# Patient Record
Sex: Female | Born: 1965 | Race: White | Hispanic: No | Marital: Married | Smoking: Never smoker
Health system: Southern US, Community
[De-identification: ages and names within clinical notes are randomized; demographics above are authoritative.]

## PROBLEM LIST (undated history)

## (undated) DIAGNOSIS — F3281 Premenstrual dysphoric disorder: Secondary | ICD-10-CM

## (undated) HISTORY — PX: KIDNEY SURGERY: SHX687

---

## 1998-05-15 ENCOUNTER — Other Ambulatory Visit: Admission: RE | Admit: 1998-05-15 | Discharge: 1998-05-15 | Payer: Self-pay | Admitting: Obstetrics and Gynecology

## 1999-09-26 ENCOUNTER — Ambulatory Visit (HOSPITAL_COMMUNITY): Admission: RE | Admit: 1999-09-26 | Discharge: 1999-09-26 | Payer: Self-pay | Admitting: Urology

## 1999-09-26 ENCOUNTER — Encounter: Payer: Self-pay | Admitting: Urology

## 1999-10-07 ENCOUNTER — Ambulatory Visit (HOSPITAL_COMMUNITY): Admission: RE | Admit: 1999-10-07 | Discharge: 1999-10-07 | Payer: Self-pay | Admitting: Urology

## 1999-10-07 ENCOUNTER — Encounter: Payer: Self-pay | Admitting: Urology

## 1999-11-13 ENCOUNTER — Ambulatory Visit (HOSPITAL_COMMUNITY): Admission: RE | Admit: 1999-11-13 | Discharge: 1999-11-13 | Payer: Self-pay | Admitting: Urology

## 1999-11-13 ENCOUNTER — Encounter: Payer: Self-pay | Admitting: Urology

## 1999-12-04 ENCOUNTER — Ambulatory Visit (HOSPITAL_COMMUNITY): Admission: RE | Admit: 1999-12-04 | Discharge: 1999-12-04 | Payer: Self-pay | Admitting: Urology

## 2000-01-09 ENCOUNTER — Inpatient Hospital Stay (HOSPITAL_COMMUNITY): Admission: RE | Admit: 2000-01-09 | Discharge: 2000-01-12 | Payer: Self-pay | Admitting: Urology

## 2000-03-12 ENCOUNTER — Other Ambulatory Visit: Admission: RE | Admit: 2000-03-12 | Discharge: 2000-03-12 | Payer: Self-pay | Admitting: Obstetrics and Gynecology

## 2000-04-30 ENCOUNTER — Encounter: Payer: Self-pay | Admitting: Urology

## 2000-04-30 ENCOUNTER — Ambulatory Visit (HOSPITAL_COMMUNITY): Admission: RE | Admit: 2000-04-30 | Discharge: 2000-04-30 | Payer: Self-pay | Admitting: Urology

## 2000-10-14 ENCOUNTER — Ambulatory Visit (HOSPITAL_COMMUNITY): Admission: RE | Admit: 2000-10-14 | Discharge: 2000-10-14 | Payer: Self-pay | Admitting: Urology

## 2000-10-14 ENCOUNTER — Encounter: Payer: Self-pay | Admitting: Urology

## 2002-03-28 ENCOUNTER — Other Ambulatory Visit: Admission: RE | Admit: 2002-03-28 | Discharge: 2002-03-28 | Payer: Self-pay | Admitting: Obstetrics and Gynecology

## 2003-03-30 ENCOUNTER — Other Ambulatory Visit: Admission: RE | Admit: 2003-03-30 | Discharge: 2003-03-30 | Payer: Self-pay | Admitting: Obstetrics and Gynecology

## 2004-04-04 ENCOUNTER — Other Ambulatory Visit: Admission: RE | Admit: 2004-04-04 | Discharge: 2004-04-04 | Payer: Self-pay | Admitting: Obstetrics and Gynecology

## 2004-12-18 ENCOUNTER — Ambulatory Visit: Payer: Self-pay | Admitting: Internal Medicine

## 2005-01-15 ENCOUNTER — Ambulatory Visit: Payer: Self-pay | Admitting: Internal Medicine

## 2005-05-01 ENCOUNTER — Other Ambulatory Visit: Admission: RE | Admit: 2005-05-01 | Discharge: 2005-05-01 | Payer: Self-pay | Admitting: Obstetrics and Gynecology

## 2005-11-18 ENCOUNTER — Ambulatory Visit: Payer: Self-pay | Admitting: Internal Medicine

## 2006-03-06 ENCOUNTER — Ambulatory Visit: Payer: Self-pay | Admitting: Internal Medicine

## 2006-05-15 ENCOUNTER — Other Ambulatory Visit: Admission: RE | Admit: 2006-05-15 | Discharge: 2006-05-15 | Payer: Self-pay | Admitting: Obstetrics and Gynecology

## 2006-10-22 ENCOUNTER — Ambulatory Visit: Payer: Self-pay | Admitting: Family Medicine

## 2006-11-26 ENCOUNTER — Ambulatory Visit: Payer: Self-pay | Admitting: Internal Medicine

## 2006-11-26 LAB — CONVERTED CEMR LAB
Basophils Relative: 1.3 % — ABNORMAL HIGH (ref 0.0–1.0)
Eosinophil percent: 2.9 % (ref 0.0–5.0)
Lymphocytes Relative: 27.3 % (ref 12.0–46.0)
Monocytes Relative: 9.4 % (ref 3.0–11.0)
Platelets: 260 10*3/uL (ref 150–400)
RBC: 4.21 M/uL (ref 3.87–5.11)
RDW: 11.1 % — ABNORMAL LOW (ref 11.5–14.6)
Sed Rate: 35 mm/hr — ABNORMAL HIGH (ref 0–25)
WBC: 7.9 10*3/uL (ref 4.5–10.5)

## 2007-01-25 ENCOUNTER — Ambulatory Visit: Payer: Self-pay | Admitting: Internal Medicine

## 2007-01-26 ENCOUNTER — Encounter: Payer: Self-pay | Admitting: Internal Medicine

## 2007-06-18 ENCOUNTER — Ambulatory Visit: Payer: Self-pay | Admitting: Internal Medicine

## 2008-08-07 ENCOUNTER — Ambulatory Visit: Payer: Self-pay | Admitting: Internal Medicine

## 2008-08-07 LAB — CONVERTED CEMR LAB
Beta hcg, urine, semiquantitative: NEGATIVE
Bilirubin Urine: NEGATIVE
Glucose, Urine, Semiquant: NEGATIVE
Ketones, urine, test strip: NEGATIVE
Specific Gravity, Urine: <1.005
pH: 7.5

## 2008-08-09 ENCOUNTER — Ambulatory Visit: Payer: Self-pay | Admitting: Internal Medicine

## 2008-08-09 ENCOUNTER — Encounter: Payer: Self-pay | Admitting: Internal Medicine

## 2008-08-09 ENCOUNTER — Encounter (INDEPENDENT_AMBULATORY_CARE_PROVIDER_SITE_OTHER): Payer: Self-pay | Admitting: *Deleted

## 2008-08-10 ENCOUNTER — Encounter (INDEPENDENT_AMBULATORY_CARE_PROVIDER_SITE_OTHER): Payer: Self-pay | Admitting: *Deleted

## 2008-08-16 ENCOUNTER — Encounter: Payer: Self-pay | Admitting: Internal Medicine

## 2008-08-24 ENCOUNTER — Ambulatory Visit (HOSPITAL_COMMUNITY): Admission: RE | Admit: 2008-08-24 | Discharge: 2008-08-24 | Payer: Self-pay | Admitting: Urology

## 2009-02-12 ENCOUNTER — Encounter: Payer: Self-pay | Admitting: Internal Medicine

## 2009-02-12 ENCOUNTER — Ambulatory Visit: Payer: Self-pay | Admitting: Internal Medicine

## 2009-02-13 ENCOUNTER — Encounter (INDEPENDENT_AMBULATORY_CARE_PROVIDER_SITE_OTHER): Payer: Self-pay | Admitting: *Deleted

## 2009-02-13 ENCOUNTER — Telehealth (INDEPENDENT_AMBULATORY_CARE_PROVIDER_SITE_OTHER): Payer: Self-pay | Admitting: *Deleted

## 2009-10-15 ENCOUNTER — Telehealth: Payer: Self-pay | Admitting: Internal Medicine

## 2009-10-16 ENCOUNTER — Ambulatory Visit: Payer: Self-pay | Admitting: Internal Medicine

## 2009-10-16 DIAGNOSIS — R079 Chest pain, unspecified: Secondary | ICD-10-CM

## 2009-10-16 DIAGNOSIS — R002 Palpitations: Secondary | ICD-10-CM

## 2009-10-16 HISTORY — DX: Palpitations: R00.2

## 2009-10-16 HISTORY — DX: Chest pain, unspecified: R07.9

## 2009-10-18 ENCOUNTER — Encounter (INDEPENDENT_AMBULATORY_CARE_PROVIDER_SITE_OTHER): Payer: Self-pay | Admitting: *Deleted

## 2010-01-05 ENCOUNTER — Ambulatory Visit: Payer: Self-pay | Admitting: Family Medicine

## 2011-01-05 LAB — CONVERTED CEMR LAB
BUN: 11 mg/dL (ref 6–23)
Basophils Relative: 3.6 % — ABNORMAL HIGH (ref 0.0–3.0)
CK-MB: 1.3 ng/mL (ref 0.3–4.0)
Calcium: 9 mg/dL (ref 8.4–10.5)
Eosinophils Absolute: 0.2 10*3/uL (ref 0.0–0.7)
Eosinophils Relative: 3.8 % (ref 0.0–5.0)
Free T4: 0.8 ng/dL (ref 0.6–1.6)
GFR calc non Af Amer: 73.29 mL/min (ref >60)
Glucose, Bld: 66 mg/dL — ABNORMAL LOW (ref 70–99)
Lymphocytes Relative: 41.2 % (ref 12.0–46.0)
MCV: 97.5 fL (ref 78.0–100.0)
Monocytes Absolute: 2 10*3/uL — ABNORMAL HIGH (ref 0.1–1.0)
Neutrophils Relative %: 18.2 % — ABNORMAL LOW (ref 43.0–77.0)
Platelets: 169 10*3/uL (ref 150.0–400.0)
Potassium: 4.1 meq/L (ref 3.5–5.1)
RBC: 4.27 M/uL (ref 3.87–5.11)
Sodium: 140 meq/L (ref 135–145)
TSH: 1.8 microintl units/mL (ref 0.35–5.50)
Total CK: 98 units/L (ref 7–177)
WBC: 5.9 10*3/uL (ref 4.5–10.5)

## 2011-01-09 NOTE — Assessment & Plan Note (Signed)
Summary: HEADACHE/FEVER/KDC   Vital Signs:  Patient profile:   45 year old female Weight:      129 pounds Temp:     98.6 degrees F oral Pulse rate:   68 / minute Pulse rhythm:   regular BP sitting:   110 / 82  (left arm) Cuff size:   regular  Vitals Entered By: Lowella Petties CMA (January 05, 2010 10:30 AM) CC: Cough, achy, feels weak.  Temp 99.7 last night.   CC:  Cough, achy, and feels weak.  Temp 99.7 last night..  History of Present Illness: 45 y/o fem w 2 days h/o nonprod. cough.  No fever etc.   Ros neg exp. EIA in college.  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  Husb has infl.  Allergies: 1)  ! * Pce 2)  ! * Tetanus 3)  ! Pyridium 4)  ! Levsin  Past History:  Past medical, surgical, family and social histories (including risk factors) reviewed for relevance to current acute and chronic problems.  Past Surgical History: Reviewed history from 08/07/2008 and no changes required. 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  Social History: Reviewed history and no changes required.  Review of Systems      See HPI  Physical Exam  General:  Well-developed,well-nourished,in no acute distress; alert,appropriate and cooperative throughout examination Head:  Normocephalic and atraumatic without obvious abnormalities. No apparent alopecia or balding. Eyes:  No corneal or conjunctival inflammation noted. EOMI. Perrla. Funduscopic exam benign, without hemorrhages, exudates or papilledema. Vision grossly normal. Ears:  External ear exam shows no significant lesions or deformities.  Otoscopic examination reveals clear canals, tympanic membranes are intact bilaterally without bulging, retraction, inflammation or discharge. Hearing is grossly normal bilaterally. Nose:  External nasal examination shows no deformity or inflammation. Nasal mucosa are pink and moist without lesions or  exudates. Mouth:  Oral mucosa and oropharynx without lesions or exudates.  Teeth in good repair. Neck:  No deformities, masses, or tenderness noted. Chest Wall:  No deformities, masses, or tenderness noted. Lungs:  Normal respiratory effort, chest expands symmetrically. Lungs are clear to auscultation, no crackles or wheezes.   Problems:  Medical Problems Added: 1)  Dx of Viral Infection-unspec  (ICD-079.99)  Impression & Recommendations:  Problem # 1:  VIRAL INFECTION-UNSPEC (ICD-079.99) Assessment New  Her updated medication list for this problem includes:    Hydrocodone-homatropine 5-1.5 Mg/21ml Syrp (Hydrocodone-homatropine) .Marland Kitchen... 1 or 2 tsps three times a day as needed cough  Complete Medication List: 1)  Multivitamins Tabs (Multiple vitamin) .Marland Kitchen.. 1 by mouth once daily 2)  B Comples  3)  Omega-3 350 Mg Caps (Omega-3 fatty acids) .Marland Kitchen.. 1 by mouth once daily 4)  Antioxidant Tabs (Multiple vitamins-minerals) .Marland Kitchen.. 1 by mouth once daily 5)  Milk Thistle 200 Mg Caps (Milk thistle) .Marland Kitchen.. 1 by mouth once daily 6)  Hydrocodone-homatropine 5-1.5 Mg/21ml Syrp (Hydrocodone-homatropine) .Marland Kitchen.. 1 or 2 tsps three times a day as needed cough  Patient Instructions: 1)  hydromet 1or 2 tsps three times a day prn 2)  Please schedule a follow-up appointment as needed. Prescriptions: HYDROCODONE-HOMATROPINE 5-1.5 MG/5ML SYRP (HYDROCODONE-HOMATROPINE) 1 or 2 tsps three times a day as needed cough  #8oz x 1   Entered and Authorized by:   Roderick Pee MD   Signed by:   Roderick Pee MD on 01/05/2010   Method used:   Print then Give to Patient  RxID:   [id number]

## 2014-01-20 DIAGNOSIS — G4700 Insomnia, unspecified: Secondary | ICD-10-CM | POA: Insufficient documentation

## 2014-01-20 HISTORY — DX: Insomnia, unspecified: G47.00

## 2014-07-08 DIAGNOSIS — R3129 Other microscopic hematuria: Secondary | ICD-10-CM | POA: Insufficient documentation

## 2014-07-08 HISTORY — DX: Other microscopic hematuria: R31.29

## 2015-05-22 DIAGNOSIS — K219 Gastro-esophageal reflux disease without esophagitis: Secondary | ICD-10-CM | POA: Insufficient documentation

## 2015-05-22 HISTORY — DX: Gastro-esophageal reflux disease without esophagitis: K21.9

## 2016-10-09 DIAGNOSIS — F3281 Premenstrual dysphoric disorder: Secondary | ICD-10-CM | POA: Insufficient documentation

## 2017-12-01 DIAGNOSIS — M659 Synovitis and tenosynovitis, unspecified: Secondary | ICD-10-CM

## 2017-12-01 DIAGNOSIS — M65979 Unspecified synovitis and tenosynovitis, unspecified ankle and foot: Secondary | ICD-10-CM | POA: Insufficient documentation

## 2017-12-01 HISTORY — DX: Unspecified synovitis and tenosynovitis, unspecified ankle and foot: M65.979

## 2018-09-08 LAB — HM PAP SMEAR: HM Pap smear: NORMAL

## 2018-11-08 LAB — HM COLONOSCOPY

## 2020-02-23 DIAGNOSIS — M659 Synovitis and tenosynovitis, unspecified: Secondary | ICD-10-CM | POA: Diagnosis not present

## 2020-02-23 DIAGNOSIS — M79671 Pain in right foot: Secondary | ICD-10-CM

## 2020-02-23 HISTORY — DX: Pain in right foot: M79.671

## 2020-04-25 DIAGNOSIS — H0102B Squamous blepharitis left eye, upper and lower eyelids: Secondary | ICD-10-CM | POA: Diagnosis not present

## 2020-04-25 DIAGNOSIS — H524 Presbyopia: Secondary | ICD-10-CM | POA: Diagnosis not present

## 2020-04-25 DIAGNOSIS — H0102A Squamous blepharitis right eye, upper and lower eyelids: Secondary | ICD-10-CM | POA: Diagnosis not present

## 2020-07-09 DIAGNOSIS — M79671 Pain in right foot: Secondary | ICD-10-CM | POA: Diagnosis not present

## 2020-07-09 DIAGNOSIS — M659 Synovitis and tenosynovitis, unspecified: Secondary | ICD-10-CM | POA: Diagnosis not present

## 2020-08-07 ENCOUNTER — Telehealth: Payer: Self-pay | Admitting: Adult Health

## 2020-08-07 NOTE — Telephone Encounter (Signed)
Called to Discuss with patient about Covid symptoms and the use of the monoclonal antibody infusion for those with mild to moderate Covid symptoms and at a high risk of hospitalization.    Unable to reach, unable to leave voicemail. Will try to reach out to again later.   Sterlin Knightly NP-C

## 2020-09-05 DIAGNOSIS — R05 Cough: Secondary | ICD-10-CM | POA: Diagnosis not present

## 2020-09-05 DIAGNOSIS — B9729 Other coronavirus as the cause of diseases classified elsewhere: Secondary | ICD-10-CM | POA: Diagnosis not present

## 2020-09-05 DIAGNOSIS — F3281 Premenstrual dysphoric disorder: Secondary | ICD-10-CM | POA: Diagnosis not present

## 2020-11-27 DIAGNOSIS — M2021 Hallux rigidus, right foot: Secondary | ICD-10-CM | POA: Insufficient documentation

## 2020-11-27 HISTORY — DX: Hallux rigidus, right foot: M20.21

## 2021-02-01 DIAGNOSIS — M19071 Primary osteoarthritis, right ankle and foot: Secondary | ICD-10-CM | POA: Diagnosis not present

## 2021-02-01 DIAGNOSIS — M2021 Hallux rigidus, right foot: Secondary | ICD-10-CM | POA: Diagnosis not present

## 2021-09-03 ENCOUNTER — Other Ambulatory Visit: Payer: Self-pay

## 2021-09-03 ENCOUNTER — Encounter (HOSPITAL_BASED_OUTPATIENT_CLINIC_OR_DEPARTMENT_OTHER): Payer: Self-pay

## 2021-09-03 ENCOUNTER — Emergency Department (HOSPITAL_BASED_OUTPATIENT_CLINIC_OR_DEPARTMENT_OTHER)
Admission: EM | Admit: 2021-09-03 | Discharge: 2021-09-04 | Disposition: A | Discharge status: Home / Self Care | Payer: Self-pay | Attending: Emergency Medicine | Admitting: Emergency Medicine

## 2021-09-03 ENCOUNTER — Emergency Department (HOSPITAL_BASED_OUTPATIENT_CLINIC_OR_DEPARTMENT_OTHER): Payer: Self-pay

## 2021-09-03 DIAGNOSIS — R519 Headache, unspecified: Secondary | ICD-10-CM | POA: Insufficient documentation

## 2021-09-03 DIAGNOSIS — R030 Elevated blood-pressure reading, without diagnosis of hypertension: Secondary | ICD-10-CM | POA: Insufficient documentation

## 2021-09-03 DIAGNOSIS — R0789 Other chest pain: Secondary | ICD-10-CM | POA: Insufficient documentation

## 2021-09-03 DIAGNOSIS — I160 Hypertensive urgency: Secondary | ICD-10-CM

## 2021-09-03 HISTORY — DX: Premenstrual dysphoric disorder: F32.81

## 2021-09-03 LAB — CBC
HCT: 37.8 % (ref 36.0–46.0)
Hemoglobin: 13.3 g/dL (ref 12.0–15.0)
MCH: 32.4 pg (ref 26.0–34.0)
MCHC: 35.2 g/dL (ref 30.0–36.0)
MCV: 92.2 fL (ref 80.0–100.0)
Platelets: 209 10*3/uL (ref 150–400)
RBC: 4.1 MIL/uL (ref 3.87–5.11)
RDW: 11.9 % (ref 11.5–15.5)
WBC: 6.7 10*3/uL (ref 4.0–10.5)
nRBC: 0 % (ref 0.0–0.2)

## 2021-09-03 LAB — BASIC METABOLIC PANEL
Anion gap: 7 (ref 5–15)
BUN: 13 mg/dL (ref 6–20)
CO2: 27 mmol/L (ref 22–32)
Calcium: 9 mg/dL (ref 8.9–10.3)
Chloride: 102 mmol/L (ref 98–111)
Creatinine, Ser: 0.64 mg/dL (ref 0.44–1.00)
GFR, Estimated: >60 mL/min (ref >60)
Glucose, Bld: 191 mg/dL — ABNORMAL HIGH (ref 70–99)
Potassium: 3.6 mmol/L (ref 3.5–5.1)
Sodium: 136 mmol/L (ref 135–145)

## 2021-09-03 LAB — TROPONIN I (HIGH SENSITIVITY): Troponin I (High Sensitivity): 4 ng/L (ref <18)

## 2021-09-03 NOTE — ED Triage Notes (Addendum)
Pt c/o CP, HA started ~615pm-states she took someone else's clonidine 0.1mg  at 645pm -NAD-steady gait-pt later added that HA "is pounding" and started yesterday with elevated BP-denies head injury

## 2021-09-04 ENCOUNTER — Emergency Department (HOSPITAL_BASED_OUTPATIENT_CLINIC_OR_DEPARTMENT_OTHER): Payer: Self-pay

## 2021-09-04 LAB — TROPONIN I (HIGH SENSITIVITY): Troponin I (High Sensitivity): 5 ng/L (ref <18)

## 2021-09-04 NOTE — ED Provider Notes (Signed)
[facility] EMERGENCY DEPARTMENT Provider Note   CSN: [id number] Arrival date & time: 09/03/21  2033     History Chief Complaint  Patient presents with   Chest Pain   Headache    COURTENAY HIRTH is a 55 y.o. female.  Patient is a 55 year old female with no significant past medical history.  She presents today for elevation of blood pressure and headache.  She had an episode yesterday where her blood pressure went to the 180s and she describes pressure in her head.  This seemed to resolve after several hours, then recurred this afternoon.  She describes pressure throughout her head and blood pressure again into the 170s.  She took one of her husbands clonidine tablets and this seemed to improve her blood pressure and headache.  She describes some tightness in her chest during these episodes.  She denies fevers or chills.  She denies any numbness, weakness, or tingling.  She denies any shortness of breath, nausea, or diaphoresis.  Patient has no cardiac history and no cardiac risk factors.  The history is provided by the patient.      Past Medical History:  Diagnosis Date   PMDD (premenstrual dysphoric disorder)     Patient Active Problem List   Diagnosis Date Noted   PALPITATIONS 10/16/2009   CHEST PAIN 10/16/2009    Past Surgical History:  Procedure Laterality Date   KIDNEY SURGERY       OB History   No obstetric history on file.     No family history on file.  Social History   Tobacco Use   Smoking status: Never   Smokeless tobacco: Never  Vaping Use   Vaping Use: Never used  Substance Use Topics   Alcohol use: Yes    Comment: daily   Drug use: Never    Home Medications Prior to Admission medications   Not on File    Allergies    Hyoscyamine sulfate  Review of Systems   Review of Systems  All other systems reviewed and are negative.  Physical Exam Updated Vital Signs BP 130/87 (BP Location: Left Arm)   Pulse (!) 57   Temp 97.8 F  (36.6 C) (Oral)   Resp 16   Ht 5\' 7"  (1.702 m)   Wt 58.5 kg   SpO2 100%   BMI 20.20 kg/m   Physical Exam Vitals and nursing note reviewed.  Constitutional:      General: She is not in acute distress.    Appearance: She is well-developed. She is not diaphoretic.  HENT:     Head: Normocephalic and atraumatic.  Cardiovascular:     Rate and Rhythm: Normal rate and regular rhythm.     Heart sounds: No murmur heard.   No friction rub. No gallop.  Pulmonary:     Effort: Pulmonary effort is normal. No respiratory distress.     Breath sounds: Normal breath sounds. No wheezing.  Abdominal:     General: Bowel sounds are normal. There is no distension.     Palpations: Abdomen is soft.     Tenderness: There is no abdominal tenderness.  Musculoskeletal:        General: Normal range of motion.     Cervical back: Normal range of motion and neck supple.     Right lower leg: No edema.     Left lower leg: No edema.  Skin:    General: Skin is warm and dry.  Neurological:     General: No focal  deficit present.     Mental Status: She is alert and oriented to person, place, and time.     Cranial Nerves: No cranial nerve deficit.     Motor: No weakness.    ED Results / Procedures / Treatments   Labs (all labs ordered are listed, but only abnormal results are displayed) Labs Reviewed  BASIC METABOLIC PANEL - Abnormal; Notable for the following components:      Result Value   Glucose, Bld 191 (*)    All other components within normal limits  CBC  TROPONIN I (HIGH SENSITIVITY)  TROPONIN I (HIGH SENSITIVITY)    EKG EKG Interpretation  Date/Time:  Tuesday September 03 2021 20:44:30 EDT Ventricular Rate:  70 PR Interval:  132 QRS Duration: 80 QT Interval:  370 QTC Calculation: 399 R Axis:   79 Text Interpretation: Normal sinus rhythm Cannot rule out Anterior infarct , age undetermined Abnormal ECG Confirmed by Geoffery Lyons ([id number]) on 09/04/2021 2:15:54 AM  Radiology DG Chest 2  View  Result Date: 09/03/2021 CLINICAL DATA:  Chest pain EXAM: CHEST - 2 VIEW COMPARISON:  02/12/2009 FINDINGS: The heart size and mediastinal contours are within normal limits. Both lungs are clear. The visualized skeletal structures are unremarkable. IMPRESSION: No active cardiopulmonary disease. Electronically Signed   By: Deatra Robinson M.D.   On: 09/03/2021 21:16    Procedures Procedures   Medications Ordered in ED Medications - No data to display  ED Course  I have reviewed the triage vital signs and the nursing notes.  Pertinent labs & imaging results that were available during my care of the patient were reviewed by me and considered in my medical decision making (see chart for details).    MDM Rules/Calculators/A&P  Patient presenting with headache and elevated blood pressure.  She took a dose of her husband's clonidine and is now feeling better.  Her blood pressure here is 120s over 80s and her work-up is unremarkable including EKG, head CT, and troponin x2.  Patient seems appropriate for discharge.  She is to keep a record of her blood pressures at home and follow-up with primary doctor.  Final Clinical Impression(s) / ED Diagnoses Final diagnoses:  None    Rx / DC Orders ED Discharge Orders     None        Geoffery Lyons, MD 09/04/21 [phone]

## 2021-09-04 NOTE — Discharge Instructions (Addendum)
Continue to monitor your blood pressure at home and keep a record of it.  Follow this up with your primary doctor in the next 1 to 2 weeks, and return to the ER if symptoms worsen or change.

## 2021-12-10 ENCOUNTER — Encounter: Payer: Self-pay | Admitting: Family

## 2021-12-10 ENCOUNTER — Encounter (HOSPITAL_BASED_OUTPATIENT_CLINIC_OR_DEPARTMENT_OTHER): Payer: Self-pay

## 2021-12-10 ENCOUNTER — Ambulatory Visit (HOSPITAL_BASED_OUTPATIENT_CLINIC_OR_DEPARTMENT_OTHER)
Admission: RE | Admit: 2021-12-10 | Discharge: 2021-12-10 | Disposition: A | Discharge status: Home / Self Care | Payer: BC Managed Care – PPO | Source: Ambulatory Visit | Attending: Family | Admitting: Family

## 2021-12-10 ENCOUNTER — Ambulatory Visit: Payer: BC Managed Care – PPO | Admitting: Family

## 2021-12-10 ENCOUNTER — Other Ambulatory Visit: Payer: Self-pay

## 2021-12-10 VITALS — BP 110/68 | HR 63 | Temp 98.1°F | Ht 67.0 in | Wt 129.4 lb

## 2021-12-10 DIAGNOSIS — Z1231 Encounter for screening mammogram for malignant neoplasm of breast: Secondary | ICD-10-CM | POA: Insufficient documentation

## 2021-12-10 DIAGNOSIS — G4700 Insomnia, unspecified: Secondary | ICD-10-CM | POA: Diagnosis not present

## 2021-12-10 DIAGNOSIS — J069 Acute upper respiratory infection, unspecified: Secondary | ICD-10-CM

## 2021-12-10 DIAGNOSIS — F3281 Premenstrual dysphoric disorder: Secondary | ICD-10-CM | POA: Diagnosis not present

## 2021-12-10 MED ORDER — FLUOXETINE HCL 20 MG PO CAPS: 20.0000 mg | ORAL_CAPSULE | Freq: Two times a day (BID) | ORAL | 3 refills | Status: DC

## 2021-12-10 MED ORDER — ZOLPIDEM TARTRATE 10 MG PO TABS: 10.0000 mg | ORAL_TABLET | Freq: Every evening | ORAL | 0 refills | Status: DC | PRN

## 2021-12-10 NOTE — Progress Notes (Signed)
Hayley Ruiz is a 56 y.o. female with the following history as recorded in Epic[facility]:  Patient Active Problem List   Diagnosis Date Noted   PALPITATIONS 10/16/2009   CHEST PAIN 10/16/2009    Current Outpatient Medications  Medication Sig Dispense Refill   B Complex Vitamins (VITAMIN B-COMPLEX) TABS Take 1 tablet by mouth daily.     fluticasone (FLONASE) 50 MCG/ACT nasal spray 2 sprays by Each Nare route daily.     Multiple Vitamin (MULTI-VITAMIN DAILY PO) Take by mouth.     FLUoxetine (PROZAC) 20 MG capsule Take 1 capsule (20 mg total) by mouth 2 (two) times daily. 180 capsule 3   zolpidem (AMBIEN) 10 MG tablet Take 1 tablet (10 mg total) by mouth at bedtime as needed for sleep. 30 tablet 0   No current facility-administered medications for this visit.    Allergies: Hyoscyamine sulfate  Past Medical History:  Diagnosis Date   PMDD (premenstrual dysphoric disorder)     Past Surgical History:  Procedure Laterality Date   KIDNEY SURGERY      No family history on file.  Social History   Tobacco Use   Smoking status: Never   Smokeless tobacco: Never  Substance Use Topics   Alcohol use: Yes    Comment: daily    Subjective:   Presents today as a new patient; is recovering from viral URI;  History of PMDD- stable on regimen of Prozac 20 mg bid; intermittent insomnia- may take Ambien once per week;   Overdue for mammogram/ pap smear; up to date on colonoscopy;   LMP 8 months ago;     Objective:  Vitals:   12/10/21 1355  BP: 110/68  Pulse: 63  Temp: 98.1 F (36.7 C)  TempSrc: Oral  SpO2: 98%  Weight: 129 lb 6.4 oz (58.7 kg)  Height: 5\' 7"  (1.702 m)    General: Well developed, well nourished, in no acute distress  Skin : Warm and dry.  Head: Normocephalic and atraumatic  Eyes: Sclera and conjunctiva clear; pupils round and reactive to light; extraocular movements intact  Ears: External normal; canals clear; tympanic membranes normal  Oropharynx: Pink, supple.  No suspicious lesions  Neck: Supple without thyromegaly, adenopathy  Lungs: Respirations unlabored; clear to auscultation bilaterally without wheeze, rales, rhonchi  CVS exam: normal rate and regular rhythm.  Neurologic: Alert and oriented; speech intact; face symmetrical; moves all extremities well; CNII-XII intact without focal deficit   Assessment:  1. Viral URI   2. PMDD (premenstrual dysphoric disorder)   3. Insomnia, unspecified type   4. Visit for screening mammogram     Plan:  Appears to be resolving; continue symptomatic treatment; increase fluids, rest; Stable; refill updated; Stable; refill updated; Order updated;   This visit occurred during the SARS-CoV-2 public health emergency.  Safety protocols were in place, including screening questions prior to the visit, additional usage of staff PPE, and extensive cleaning of exam room while observing appropriate contact time as indicated for disinfecting solutions.    Return for Fasting CPE/ pap smear.  Orders Placed This Encounter  Procedures   MM 3D SCREEN BREAST BILATERAL    Standing Status:   Future    Number of Occurrences:   1    Standing Expiration Date:   12/10/2022    Order Specific Question:   Reason for Exam (SYMPTOM  OR DIAGNOSIS REQUIRED)    Answer:   screening mammogram    Order Specific Question:   Is the patient pregnant?  Answer:   No    Order Specific Question:   Preferred imaging location?    Answer:   [facility]   HM PAP SMEAR    This external order was created through the Results Console.   HM COLONOSCOPY    This external order was created through the Results Console.    Requested Prescriptions   Signed Prescriptions Disp Refills   FLUoxetine (PROZAC) 20 MG capsule 180 capsule 3    Sig: Take 1 capsule (20 mg total) by mouth 2 (two) times daily.   zolpidem (AMBIEN) 10 MG tablet 30 tablet 0    Sig: Take 1 tablet (10 mg total) by mouth at bedtime as needed for sleep.

## 2022-01-17 ENCOUNTER — Encounter: Payer: Self-pay | Admitting: Family

## 2022-01-17 ENCOUNTER — Ambulatory Visit (INDEPENDENT_AMBULATORY_CARE_PROVIDER_SITE_OTHER): Payer: BC Managed Care – PPO | Admitting: Family

## 2022-01-17 ENCOUNTER — Other Ambulatory Visit (HOSPITAL_COMMUNITY)
Admission: RE | Admit: 2022-01-17 | Discharge: 2022-01-17 | Disposition: A | Discharge status: Home / Self Care | Payer: BC Managed Care – PPO | Source: Ambulatory Visit | Attending: Family | Admitting: Family

## 2022-01-17 VITALS — BP 120/80 | HR 66 | Temp 98.3°F | Ht 67.0 in | Wt 130.2 lb

## 2022-01-17 DIAGNOSIS — Z0000 Encounter for general adult medical examination without abnormal findings: Secondary | ICD-10-CM

## 2022-01-17 DIAGNOSIS — Z23 Encounter for immunization: Secondary | ICD-10-CM | POA: Diagnosis not present

## 2022-01-17 DIAGNOSIS — Z124 Encounter for screening for malignant neoplasm of cervix: Secondary | ICD-10-CM | POA: Diagnosis not present

## 2022-01-17 DIAGNOSIS — Z13220 Encounter for screening for lipoid disorders: Secondary | ICD-10-CM

## 2022-01-17 LAB — CBC WITH DIFFERENTIAL/PLATELET
Basophils Absolute: 0.1 10*3/uL (ref 0.0–0.1)
Basophils Relative: 1.4 % (ref 0.0–3.0)
Eosinophils Absolute: 0.1 10*3/uL (ref 0.0–0.7)
Eosinophils Relative: 1.6 % (ref 0.0–5.0)
HCT: 39.2 % (ref 36.0–46.0)
Hemoglobin: 13.4 g/dL (ref 12.0–15.0)
Lymphocytes Relative: 34.4 % (ref 12.0–46.0)
Lymphs Abs: 2 10*3/uL (ref 0.7–4.0)
MCHC: 34.2 g/dL (ref 30.0–36.0)
MCV: 94.1 fl (ref 78.0–100.0)
Monocytes Absolute: 0.5 10*3/uL (ref 0.1–1.0)
Monocytes Relative: 8.2 % (ref 3.0–12.0)
Neutro Abs: 3.2 10*3/uL (ref 1.4–7.7)
Neutrophils Relative %: 54.4 % (ref 43.0–77.0)
Platelets: 216 10*3/uL (ref 150.0–400.0)
RBC: 4.16 Mil/uL (ref 3.87–5.11)
RDW: 12 % (ref 11.5–15.5)
WBC: 5.9 10*3/uL (ref 4.0–10.5)

## 2022-01-17 LAB — COMPREHENSIVE METABOLIC PANEL
ALT: 20 U/L (ref 0–35)
AST: 20 U/L (ref 0–37)
Albumin: 4 g/dL (ref 3.5–5.2)
Alkaline Phosphatase: 53 U/L (ref 39–117)
BUN: 12 mg/dL (ref 6–23)
CO2: 34 mEq/L — ABNORMAL HIGH (ref 19–32)
Calcium: 9.1 mg/dL (ref 8.4–10.5)
Chloride: 105 mEq/L (ref 96–112)
Creatinine, Ser: 0.67 mg/dL (ref 0.40–1.20)
GFR: 99.82 mL/min (ref >60.00)
Glucose, Bld: 91 mg/dL (ref 70–99)
Potassium: 4.1 mEq/L (ref 3.5–5.1)
Sodium: 141 mEq/L (ref 135–145)
Total Bilirubin: 0.8 mg/dL (ref 0.2–1.2)
Total Protein: 6.3 g/dL (ref 6.0–8.3)

## 2022-01-17 LAB — LIPID PANEL
Cholesterol: 204 mg/dL — ABNORMAL HIGH (ref 0–200)
HDL: 72.4 mg/dL (ref >39.00)
LDL Cholesterol: 115 mg/dL — ABNORMAL HIGH (ref 0–99)
NonHDL: 131.47
Total CHOL/HDL Ratio: 3
Triglycerides: 84 mg/dL (ref 0.0–149.0)
VLDL: 16.8 mg/dL (ref 0.0–40.0)

## 2022-01-17 LAB — TSH: TSH: 2.13 u[IU]/mL (ref 0.35–5.50)

## 2022-01-17 NOTE — Progress Notes (Signed)
Hayley Ruiz is a 56 y.o. female with the following history as recorded in EpicCare:  Patient Active Problem List   Diagnosis Date Noted   PALPITATIONS 10/16/2009   CHEST PAIN 10/16/2009    Current Outpatient Medications  Medication Sig Dispense Refill   B Complex Vitamins (VITAMIN B-COMPLEX) TABS Take 1 tablet by mouth daily.     FLUoxetine (PROZAC) 20 MG capsule Take 1 capsule (20 mg total) by mouth 2 (two) times daily. 180 capsule 3   fluticasone (FLONASE) 50 MCG/ACT nasal spray 2 sprays by Each Nare route daily.     Multiple Vitamin (MULTI-VITAMIN DAILY PO) Take by mouth.     zolpidem (AMBIEN) 10 MG tablet Take 1 tablet (10 mg total) by mouth at bedtime as needed for sleep. 30 tablet 0   No current facility-administered medications for this visit.    Allergies: Hyoscyamine sulfate  Past Medical History:  Diagnosis Date   PMDD (premenstrual dysphoric disorder)     Past Surgical History:  Procedure Laterality Date   KIDNEY SURGERY      No family history on file.  Social History   Tobacco Use   Smoking status: Never   Smokeless tobacco: Never  Substance Use Topics   Alcohol use: Yes    Comment: daily    Subjective:  Presents for yearly CPE; needs fasting labs and pap smear updated; would like to get Shingrix; has been having increased pain with left hip/ low back- planning to see her chiropractor for adjustment; Up to date with dental and vision exams;  Review of Systems  Constitutional: Negative.   HENT: Negative.    Eyes: Negative.   Respiratory: Negative.    Cardiovascular: Negative.   Gastrointestinal: Negative.   Genitourinary: Negative.   Musculoskeletal: Negative.   Skin: Negative.   Neurological: Negative.   Endo/Heme/Allergies: Negative.   Psychiatric/Behavioral: Negative.        Objective:  Vitals:   01/17/22 1014  BP: 120/80  Pulse: 66  Temp: 98.3 F (36.8 C)  SpO2: 99%  Weight: 130 lb 3.2 oz (59.1 kg)  Height: _0  (1.702 m)     General: Well developed, well nourished, in no acute distress  Skin : Warm and dry.  Head: Normocephalic and atraumatic  Eyes: Sclera and conjunctiva clear; pupils round and reactive to light; extraocular movements intact  Ears: External normal; canals clear; tympanic membranes normal  Oropharynx: Pink, supple. No suspicious lesions  Neck: Supple without thyromegaly, adenopathy  Lungs: Respirations unlabored; clear to auscultation bilaterally without wheeze, rales, rhonchi  CVS exam: normal rate and regular rhythm.  Abdomen: Soft; nontender; nondistended; normoactive bowel sounds; no masses or hepatosplenomegaly  Musculoskeletal: No deformities; no active joint inflammation  Extremities: No edema, cyanosis, clubbing  Vessels: Symmetric bilaterally  Neurologic: Alert and oriented; speech intact; face symmetrical; moves all extremities well; CNII-XII intact without focal deficit  Assessment:  1. PE (physical exam), annual   2. Lipid screening   3. Cervical cancer screening   4. Need for shingles vaccine     Plan:   Age appropriate preventive healthcare needs addressed; encouraged regular eye doctor and dental exams; encouraged regular exercise; will update labs and refills as needed today; follow-up to be determined; Thin Prep pap collected; Shingrix #1 updated- follow up in 2 months; If hip/ back pain persist after seeing chiropractor, patient to follow up for imaging and possible PT or ortho referral.   This visit occurred during the SARS-CoV-2 public health emergency.  Safety protocols were  in place, including screening questions prior to the visit, additional usage of staff PPE, and extensive cleaning of exam room while observing appropriate contact time as indicated for disinfecting solutions.     Return in about 2 months (around 03/17/2022) for nurse visit/ Shingrix #2.  Orders Placed This Encounter  Procedures   Varicella-zoster vaccine IM   CBC with Differential/Platelet    Comp Met (CMET)   Lipid panel   TSH    Requested Prescriptions    No prescriptions requested or ordered in this encounter

## 2022-01-21 LAB — CYTOLOGY - PAP
Comment: NEGATIVE
Diagnosis: NEGATIVE
High risk HPV: NEGATIVE

## 2022-03-28 ENCOUNTER — Ambulatory Visit (INDEPENDENT_AMBULATORY_CARE_PROVIDER_SITE_OTHER): Payer: BC Managed Care – PPO

## 2022-03-28 DIAGNOSIS — Z23 Encounter for immunization: Secondary | ICD-10-CM | POA: Diagnosis not present

## 2022-03-28 NOTE — Progress Notes (Signed)
Hayley Ruiz is a 56 y.o. female presents to the office today for a shingles vaccine. This is Shingrix 2/2 ? ?Vaccine given in right deltoid. Patient tolerated injection well. ? ? ?Raynelle Bring Frierson-Sandells , CMA  ?

## 2022-04-28 ENCOUNTER — Other Ambulatory Visit: Payer: Self-pay | Admitting: Family

## 2022-04-28 NOTE — Telephone Encounter (Signed)
Requesting: Ambien 10mg   Contract: None UDS: None Last Visit: 01/17/22 Next Visit: None Last Refill: 12/10/21 #30 and 0RF  Please Advise

## 2022-04-29 MED ORDER — ZOLPIDEM TARTRATE 10 MG PO TABS: 10.0000 mg | ORAL_TABLET | Freq: Every evening | ORAL | 0 refills | Status: DC | PRN

## 2022-05-09 DIAGNOSIS — Z20822 Contact with and (suspected) exposure to covid-19: Secondary | ICD-10-CM | POA: Diagnosis not present

## 2022-05-09 DIAGNOSIS — B349 Viral infection, unspecified: Secondary | ICD-10-CM | POA: Diagnosis not present

## 2022-05-09 DIAGNOSIS — H66001 Acute suppurative otitis media without spontaneous rupture of ear drum, right ear: Secondary | ICD-10-CM | POA: Diagnosis not present

## 2022-05-09 DIAGNOSIS — J040 Acute laryngitis: Secondary | ICD-10-CM | POA: Diagnosis not present

## 2022-09-01 ENCOUNTER — Other Ambulatory Visit: Payer: Self-pay | Admitting: Family

## 2022-09-01 NOTE — Telephone Encounter (Signed)
Patient is requesting a refill of the following medications: Requested Prescriptions   Pending Prescriptions Disp Refills   zolpidem (AMBIEN) 10 MG tablet 30 tablet 0    Sig: Take 1 tablet (10 mg total) by mouth at bedtime as needed for sleep.    Last Visit: 01/17/22 cpe Next Visit: none scheduled  Last Refill: 04/29/22 Last fill amount #30  Please Advise

## 2022-09-02 MED ORDER — ZOLPIDEM TARTRATE 10 MG PO TABS: 10.0000 mg | ORAL_TABLET | Freq: Every evening | ORAL | 0 refills | Status: DC | PRN

## 2022-12-29 ENCOUNTER — Other Ambulatory Visit: Payer: Self-pay | Admitting: Family

## 2022-12-29 NOTE — Telephone Encounter (Signed)
Requesting: zolpidem 10mg  Last Visit: 01/17/22 Next Visit: none Last Refill: 09/02/22  Please Advise

## 2022-12-30 MED ORDER — ZOLPIDEM TARTRATE 10 MG PO TABS: 10.0000 mg | ORAL_TABLET | Freq: Every evening | ORAL | 0 refills | Status: DC | PRN

## 2023-01-22 ENCOUNTER — Encounter: Payer: Self-pay | Admitting: Family

## 2023-01-22 ENCOUNTER — Ambulatory Visit (INDEPENDENT_AMBULATORY_CARE_PROVIDER_SITE_OTHER): Payer: BC Managed Care – PPO | Admitting: Family

## 2023-01-22 VITALS — BP 118/68 | HR 76 | Resp 18 | Ht 67.0 in | Wt 129.6 lb

## 2023-01-22 DIAGNOSIS — Z0000 Encounter for general adult medical examination without abnormal findings: Secondary | ICD-10-CM

## 2023-01-22 DIAGNOSIS — I341 Nonrheumatic mitral (valve) prolapse: Secondary | ICD-10-CM | POA: Diagnosis not present

## 2023-01-22 DIAGNOSIS — R030 Elevated blood-pressure reading, without diagnosis of hypertension: Secondary | ICD-10-CM | POA: Diagnosis not present

## 2023-01-22 DIAGNOSIS — R0789 Other chest pain: Secondary | ICD-10-CM

## 2023-01-22 DIAGNOSIS — Z13220 Encounter for screening for lipoid disorders: Secondary | ICD-10-CM

## 2023-01-22 DIAGNOSIS — Z1231 Encounter for screening mammogram for malignant neoplasm of breast: Secondary | ICD-10-CM

## 2023-01-22 DIAGNOSIS — Z23 Encounter for immunization: Secondary | ICD-10-CM

## 2023-01-22 LAB — LIPID PANEL
Cholesterol: 238 mg/dL — ABNORMAL HIGH (ref 0–200)
HDL: 78.3 mg/dL (ref >39.00)
LDL Cholesterol: 136 mg/dL — ABNORMAL HIGH (ref 0–99)
NonHDL: 159.62
Total CHOL/HDL Ratio: 3
Triglycerides: 116 mg/dL (ref 0.0–149.0)
VLDL: 23.2 mg/dL (ref 0.0–40.0)

## 2023-01-22 LAB — COMPREHENSIVE METABOLIC PANEL
ALT: 19 U/L (ref 0–35)
AST: 22 U/L (ref 0–37)
Albumin: 4.3 g/dL (ref 3.5–5.2)
Alkaline Phosphatase: 54 U/L (ref 39–117)
BUN: 11 mg/dL (ref 6–23)
CO2: 29 mEq/L (ref 19–32)
Calcium: 9.4 mg/dL (ref 8.4–10.5)
Chloride: 104 mEq/L (ref 96–112)
Creatinine, Ser: 0.74 mg/dL (ref 0.40–1.20)
GFR: 91.74 mL/min (ref >60.00)
Glucose, Bld: 89 mg/dL (ref 70–99)
Potassium: 3.9 mEq/L (ref 3.5–5.1)
Sodium: 140 mEq/L (ref 135–145)
Total Bilirubin: 1 mg/dL (ref 0.2–1.2)
Total Protein: 6.6 g/dL (ref 6.0–8.3)

## 2023-01-22 LAB — CBC WITH DIFFERENTIAL/PLATELET
Basophils Absolute: 0.1 10*3/uL (ref 0.0–0.1)
Basophils Relative: 1.4 % (ref 0.0–3.0)
Eosinophils Absolute: 0.2 10*3/uL (ref 0.0–0.7)
Eosinophils Relative: 3.2 % (ref 0.0–5.0)
HCT: 40.5 % (ref 36.0–46.0)
Hemoglobin: 13.8 g/dL (ref 12.0–15.0)
Lymphocytes Relative: 29.8 % (ref 12.0–46.0)
Lymphs Abs: 2 10*3/uL (ref 0.7–4.0)
MCHC: 34.1 g/dL (ref 30.0–36.0)
MCV: 93 fl (ref 78.0–100.0)
Monocytes Absolute: 0.5 10*3/uL (ref 0.1–1.0)
Monocytes Relative: 7.3 % (ref 3.0–12.0)
Neutro Abs: 4 10*3/uL (ref 1.4–7.7)
Neutrophils Relative %: 58.3 % (ref 43.0–77.0)
Platelets: 234 10*3/uL (ref 150.0–400.0)
RBC: 4.36 Mil/uL (ref 3.87–5.11)
RDW: 12.3 % (ref 11.5–15.5)
WBC: 6.8 10*3/uL (ref 4.0–10.5)

## 2023-01-22 LAB — TSH: TSH: 2.37 u[IU]/mL (ref 0.35–5.50)

## 2023-01-22 MED ORDER — LISINOPRIL 5 MG PO TABS: ORAL_TABLET | ORAL | 0 refills | Status: DC

## 2023-01-22 NOTE — Patient Instructions (Signed)
Please start checking your blood pressure regularly;

## 2023-01-22 NOTE — Progress Notes (Signed)
Hayley Ruiz is a 57 y.o. female with the following history as recorded in EpicCare:  Patient Active Problem List   Diagnosis Date Noted   PALPITATIONS 10/16/2009   CHEST PAIN 10/16/2009    Current Outpatient Medications  Medication Sig Dispense Refill   B Complex Vitamins (VITAMIN B-COMPLEX) TABS Take 1 tablet by mouth daily.     FLUoxetine (PROZAC) 20 MG capsule Take 1 capsule (20 mg total) by mouth 2 (two) times daily. 180 capsule 3   fluticasone (FLONASE) 50 MCG/ACT nasal spray 2 sprays by Each Nare route daily.     lisinopril (ZESTRIL) 5 MG tablet Take 1 tablet daily by mouth as needed for blood pressure elevated above 150/90 30 tablet 0   Multiple Vitamin (MULTI-VITAMIN DAILY PO) Take by mouth.     zolpidem (AMBIEN) 10 MG tablet Take 1 tablet (10 mg total) by mouth at bedtime as needed for sleep. 30 tablet 0   No current facility-administered medications for this visit.    Allergies: Hyoscyamine sulfate  Past Medical History:  Diagnosis Date   PMDD (premenstrual dysphoric disorder)     Past Surgical History:  Procedure Laterality Date   KIDNEY SURGERY      No family history on file.  Social History   Tobacco Use   Smoking status: Never   Smokeless tobacco: Never  Substance Use Topics   Alcohol use: Yes    Comment: daily    Subjective:   Presents for yearly CPE; pap smear updated in 2023; due for colonoscopy later this year; mammogram needs to be updated;   notes has been having elevated blood pressure recently- " I know that it is stress related." Notes that a few weeks ago, her blood pressure was extremely elevated at 180/110; used left over Lisinopril 5 mg with almost immediate benefit; last took Lisinopril about 1 week ago; history of mitral valve prolpase; Strong FH of heart "issues" on both side;   Review of Systems  Constitutional: Negative.   HENT: Negative.    Eyes: Negative.   Respiratory: Negative.    Cardiovascular:  Positive for chest pain and  palpitations.  Gastrointestinal: Negative.   Genitourinary: Negative.   Musculoskeletal: Negative.   Skin: Negative.   Neurological: Negative.   Endo/Heme/Allergies: Negative.   Psychiatric/Behavioral: Negative.       Objective:  Vitals:   01/22/23 1032  BP: 118/68  Pulse: 76  Resp: 18  SpO2: 98%  Weight: 129 lb 9.6 oz (58.8 kg)  Height: 5' 7"$  (1.702 m)    General: Well developed, well nourished, in no acute distress  Skin : Warm and dry.  Head: Normocephalic and atraumatic  Eyes: Sclera and conjunctiva clear; pupils round and reactive to light; extraocular movements intact  Ears: External normal; canals clear; tympanic membranes normal  Oropharynx: Pink, supple. No suspicious lesions  Neck: Supple without thyromegaly, adenopathy  Lungs: Respirations unlabored; clear to auscultation bilaterally without wheeze, rales, rhonchi  CVS exam: normal rate and regular rhythm.  Abdomen: Soft; nontender; nondistended; normoactive bowel sounds; no masses or hepatosplenomegaly  Musculoskeletal: No deformities; no active joint inflammation  Extremities: No edema, cyanosis, clubbing  Vessels: Symmetric bilaterally  Neurologic: Alert and oriented; speech intact; face symmetrical; moves all extremities well; CNII-XII intact without focal deficit   Assessment:  1. PE (physical exam), annual   2. Atypical chest pain   3. MVP (mitral valve prolapse)   4. Elevated blood pressure reading   5. Lipid screening   6. Need for Tdap  vaccination   7. Visit for screening mammogram     Plan:  Age appropriate preventive healthcare needs addressed; encouraged regular eye doctor and dental exams; encouraged regular exercise; will update labs and refills as needed today; Tdap updated today; mammogram order updated;   Discussed concern for such elevated blood pressure readings and family history of heart disease as well as her personal history of MVP, in agreement that needs to see cardiology; referral  updated as requested to provider in [facility] system; will give short term refill on Lisinopril 5 mg to use as needed if blood pressure above 140/ 90; follow up to be determined.    No follow-ups on file.  Orders Placed This Encounter  Procedures   MM Digital Screening    Standing Status:   Future    Standing Expiration Date:   01/23/2024    Order Specific Question:   Reason for Exam (SYMPTOM  OR DIAGNOSIS REQUIRED)    Answer:   screening mammogram    Order Specific Question:   Is the patient pregnant?    Answer:   No    Order Specific Question:   Preferred imaging location?    Answer:   [facility]   Tdap vaccine greater than or equal to 7yo IM   CBC with Differential/Platelet   Comp Met (CMET)   Lipid panel   TSH   Ambulatory referral to Cardiology    Referral Priority:   Routine    Referral Type:   Consultation    Referral Reason:   Specialty Services Required    Requested Specialty:   Cardiology    Number of Visits Requested:   1    Requested Prescriptions   Signed Prescriptions Disp Refills   lisinopril (ZESTRIL) 5 MG tablet 30 tablet 0    Sig: Take 1 tablet daily by mouth as needed for blood pressure elevated above 150/90

## 2023-02-04 ENCOUNTER — Encounter: Payer: Self-pay | Admitting: Family

## 2023-02-10 ENCOUNTER — Encounter (HOSPITAL_BASED_OUTPATIENT_CLINIC_OR_DEPARTMENT_OTHER): Payer: Self-pay

## 2023-02-10 ENCOUNTER — Ambulatory Visit (HOSPITAL_BASED_OUTPATIENT_CLINIC_OR_DEPARTMENT_OTHER)
Admission: RE | Admit: 2023-02-10 | Discharge: 2023-02-10 | Disposition: A | Discharge status: Home / Self Care | Payer: BC Managed Care – PPO | Source: Ambulatory Visit | Attending: Family | Admitting: Family

## 2023-02-10 DIAGNOSIS — Z1231 Encounter for screening mammogram for malignant neoplasm of breast: Secondary | ICD-10-CM

## 2023-04-07 ENCOUNTER — Encounter: Payer: Self-pay | Admitting: Cardiology

## 2023-04-07 ENCOUNTER — Ambulatory Visit: Payer: BC Managed Care – PPO | Attending: Cardiology | Admitting: Cardiology

## 2023-04-07 VITALS — BP 108/78 | HR 66 | Ht 67.0 in | Wt 132.0 lb

## 2023-04-07 DIAGNOSIS — R002 Palpitations: Secondary | ICD-10-CM

## 2023-04-07 DIAGNOSIS — I341 Nonrheumatic mitral (valve) prolapse: Secondary | ICD-10-CM | POA: Diagnosis not present

## 2023-04-07 DIAGNOSIS — I10 Essential (primary) hypertension: Secondary | ICD-10-CM

## 2023-04-07 DIAGNOSIS — E785 Hyperlipidemia, unspecified: Secondary | ICD-10-CM | POA: Diagnosis not present

## 2023-04-07 MED ORDER — CLONIDINE HCL 0.1 MG PO TABS: 0.1000 mg | ORAL_TABLET | Freq: Every day | ORAL | 3 refills | Status: DC

## 2023-04-07 NOTE — Patient Instructions (Addendum)
Medication Instructions:   START: Clonidine .1mg  1 tablet daily at bedtime   Lab Work: None Ordered If you have labs (blood work) drawn today and your tests are completely normal, you will receive your results only by: MyChart Message (if you have MyChart) OR A paper copy in the mail If you have any lab test that is abnormal or we need to change your treatment, we will call you to review the results.   Testing/Procedures: Your physician has requested that you have an echocardiogram. Echocardiography is a painless test that uses sound waves to create images of your heart. It provides your doctor with information about the size and shape of your heart and how well your heart's chambers and valves are working. This procedure takes approximately one hour. There are no restrictions for this procedure. Please do NOT wear cologne, perfume, aftershave, or lotions (deodorant is allowed). Please arrive 15 minutes prior to your appointment time.   We will order CT coronary calcium score. It will cost $99.00 and is not covered by insurance.  Please call to schedule.    [facility] [address] [place], [place] [address] [phone]     Follow-Up: At [facility], you and your health needs are our priority.  As part of our continuing mission to provide you with exceptional heart care, we have created designated Provider Care Teams.  These Care Teams include your primary Cardiologist (physician) and Advanced Practice Providers (APPs -  Physician Assistants and Nurse Practitioners) who all work together to provide you with the care you need, when you need it.  We recommend signing up for the patient portal called "MyChart".  Sign up information is provided on this After Visit Summary.  MyChart is used to connect with patients for Virtual Visits (Telemedicine).  Patients are able to view lab/test results, encounter notes, upcoming appointments, etc.  Non-urgent messages can be sent  to your provider as well.   To learn more about what you can do with MyChart, go to [email or web address].    Your next appointment:   3 month(s)  The format for your next appointment:   In Person  Provider:   Gypsy Balsam, MD    Other Instructions NA

## 2023-04-07 NOTE — Progress Notes (Unsigned)
Cardiology Consultation:    Date:  04/07/2023   ID:  Hayley Ruiz, DOB January 18, 1966, MRN [id number]  PCP:  Olive Bass, FNP  Cardiologist:  Gypsy Balsam, MD   Referring MD: Olive Bass,*   Chief Complaint  Patient presents with   Hypertension    History of Present Illness:    Hayley Ruiz is a 57 y.o. female who is being seen today for the evaluation of hypertension at the request of Olive Bass,*.  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  She exercised on the regular basis she does have 5 dogs and walk with them all the time she did not notice any decrease in ability to exercise, there is no chest pain tightness squeezing pressure burning chest.  Sometimes when she is sick she will feel some palpitations.  She was told to have mitral valve prolapse many years ago however never had echocardiogram done.  Was never a problem she however described that it before when she had mitral valve prolapse diagnosis she would experience some angina pain.  Now she does not have any pain.  Concerns about her high blood pressure.  She started self-medicating with lisinopril 5 mg daily and then she added half a tablet of 0.3 mg of clonidine daily.  She noticed that it makes her very sleepy so she takes this at evening time usually she cannot tolerate this during the day.  Actually the benefits of it is the fact that it helps her to sleep and she does not have to take Ambien.  Again she is very active she exercises on the regular basis she is trying to eat right.  Never smoked does have family history of congestive heart failure but does not look like coronary artery disease.  Past Medical History:  Diagnosis Date   CHEST PAIN 10/16/2009    Qualifier: Diagnosis of   By: Alwyn Ren MD, Chrissie Noa       Gastroesophageal reflux disease without esophagitis 05/22/2015   Hallux rigidus of right foot 11/27/2020   Formatting of this note might be different from the original. Added automatically from request for surgery [id number] Formatting of this note might be different from the original. Added automatically from request for surgery [id number]   Insomnia 01/20/2014   Microscopic hematuria 07/08/2014   PALPITATIONS 10/16/2009   Qualifier: Diagnosis of   By: Alwyn Ren MD, Chrissie Noa       PMDD (premenstrual dysphoric disorder)    Right foot pain 02/23/2020   Synovitis of toe 12/01/2017    Past Surgical History:  Procedure Laterality Date   KIDNEY SURGERY      Current Medications: Current Meds  Medication Sig   B Complex Vitamins (VITAMIN B-COMPLEX) TABS Take 1 tablet by mouth daily.   cloNIDine (CATAPRES) 0.1 MG tablet Take 1.5 mg by mouth at bedtime as needed.   FLUoxetine (PROZAC) 20 MG capsule Take 1 capsule (20 mg total) by mouth 2 (two) times daily.   fluticasone (FLONASE) 50 MCG/ACT nasal spray Place 2 sprays into both nostrils daily.   lisinopril (ZESTRIL) 5 MG tablet Take 1 tablet daily by mouth as needed for blood pressure elevated above 150/90 (Patient taking differently: Take 5 mg by mouth daily. Take 1 tablet daily by mouth  as needed for blood pressure elevated above 150/90)   Multiple Vitamin (MULTI-VITAMIN DAILY PO) Take 1 tablet by mouth daily.   zolpidem (AMBIEN) 10 MG tablet Take 1 tablet (10 mg total) by mouth at bedtime as needed for sleep.     Allergies:   Hyoscyamine sulfate   Social History   Socioeconomic History   Marital status: Married    Spouse name: Not on file   Number of children: Not on file   Years of education: Not on file   Highest education level: Not on file  Occupational History   Not on file  Tobacco Use   Smoking status: Never   Smokeless tobacco: Never  Vaping Use   Vaping Use: Never used   Substance and Sexual Activity   Alcohol use: Yes    Comment: daily   Drug use: Never   Sexual activity: Not on file  Other Topics Concern   Not on file  Social History Narrative   Not on file   Social Determinants of Health   Financial Resource Strain: Not on file  Food Insecurity: Not on file  Transportation Needs: Not on file  Physical Activity: Not on file  Stress: Not on file  Social Connections: Not on file     Family History: The patient's family history is not on file. ROS:   Please see the history of present illness.    All 14 point review of systems negative except as described per history of present illness.  EKGs/Labs/Other Studies Reviewed:    The following studies were reviewed today:   EKG:  EKG is  ordered today.  The ekg ordered today demonstrates normal sinus rhythm normal P interval low voltage  Recent Labs: 01/22/2023: ALT 19; BUN 11; Creatinine, Ser 0.74; Hemoglobin 13.8; Platelets 234.0; Potassium 3.9; Sodium 140; TSH 2.37  Recent Lipid Panel    Component Value Date/Time   CHOL 238 (H) 01/22/2023 1127   TRIG 116.0 01/22/2023 1127   HDL 78.30 01/22/2023 1127   CHOLHDL 3 01/22/2023 1127   VLDL 23.2 01/22/2023 1127   LDLCALC 136 (H) 01/22/2023 1127    Physical Exam:    VS:  BP 108/78 (BP Location: Left Arm, Patient Position: Sitting)   Pulse 66   Ht 5\' 7"  (1.702 m)   Wt 132 lb (59.9 kg)   SpO2 99%   BMI 20.67 kg/m     Wt Readings from Last 3 Encounters:  04/07/23 132 lb (59.9 kg)  01/22/23 129 lb 9.6 oz (58.8 kg)  01/17/22 130 lb 3.2 oz (59.1 kg)     GEN:  Well nourished, well developed in no acute distress HEENT: Normal NECK: No JVD; No carotid bruits LYMPHATICS: No lymphadenopathy CARDIAC: RRR, no murmurs, no rubs, no gallops RESPIRATORY:  Clear to auscultation without rales, wheezing or rhonchi  ABDOMEN: Soft, non-tender, non-distended MUSCULOSKELETAL:  No edema; No deformity  SKIN: Warm and dry NEUROLOGIC:  Alert and  oriented x 3 PSYCHIATRIC:  Normal affect   ASSESSMENT:    1. Essential hypertension   2. Mitral valve prolapse   3. Dyslipidemia   4. PALPITATIONS    PLAN:    In order of problems listed above:  Essential hypertension.  Blood pressure actually on the lower side today.  I want to convince her to stop taking clonidine just increase dose of lisinopril however she prefers to keep taking clonidine which helped her sleep.  I will reduce dose from 0.15-0.1 daily.  I asked her to keep checking  her blood pressure. History of mitral valve prolapse, I do not hear any click I do not hear any murmur, echocardiogram will be done to check for left ventricle hypertrophy, heart function as well as mitral valve prolapse. Dyslipidemia I did review her K PN which show me her LDL of 136 and HDL 78.  I calculated 10 years predicted risk which is low 2%.  I offer her calcium score she would like to know if she is at any risk for having vascular problems that will help to determine that that will also help to determine if we need to have any therapy for her cholesterol except for what she is doing right now.   Medication Adjustments/Labs and Tests Ordered: Current medicines are reviewed at length with the patient today.  Concerns regarding medicines are outlined above.  No orders of the defined types were placed in this encounter.  No orders of the defined types were placed in this encounter.   Signed, Georgeanna Lea, MD, [facility]. 04/07/2023 9:22 AM    [facility]

## 2023-04-10 ENCOUNTER — Encounter: Payer: Self-pay | Admitting: Cardiology

## 2023-04-10 ENCOUNTER — Ambulatory Visit (HOSPITAL_BASED_OUTPATIENT_CLINIC_OR_DEPARTMENT_OTHER)
Admission: RE | Admit: 2023-04-10 | Discharge: 2023-04-10 | Disposition: A | Discharge status: Home / Self Care | Payer: BC Managed Care – PPO | Source: Ambulatory Visit | Attending: Cardiology | Admitting: Cardiology

## 2023-04-10 DIAGNOSIS — I341 Nonrheumatic mitral (valve) prolapse: Secondary | ICD-10-CM | POA: Insufficient documentation

## 2023-04-10 DIAGNOSIS — E785 Hyperlipidemia, unspecified: Secondary | ICD-10-CM | POA: Insufficient documentation

## 2023-04-10 NOTE — Telephone Encounter (Signed)
Error

## 2023-04-17 ENCOUNTER — Encounter: Payer: Self-pay | Admitting: Cardiology

## 2023-04-23 ENCOUNTER — Ambulatory Visit: Payer: BC Managed Care – PPO | Attending: Cardiology

## 2023-04-23 DIAGNOSIS — I341 Nonrheumatic mitral (valve) prolapse: Secondary | ICD-10-CM | POA: Diagnosis not present

## 2023-04-23 LAB — ECHOCARDIOGRAM COMPLETE: S' Lateral: 3.1 cm

## 2023-04-29 ENCOUNTER — Telehealth: Payer: Self-pay

## 2023-04-29 DIAGNOSIS — I341 Nonrheumatic mitral (valve) prolapse: Secondary | ICD-10-CM | POA: Diagnosis not present

## 2023-04-29 DIAGNOSIS — R030 Elevated blood-pressure reading, without diagnosis of hypertension: Secondary | ICD-10-CM | POA: Insufficient documentation

## 2023-04-29 DIAGNOSIS — R0789 Other chest pain: Secondary | ICD-10-CM | POA: Diagnosis not present

## 2023-04-29 NOTE — Telephone Encounter (Signed)
Patient notified through my chart.

## 2023-04-29 NOTE — Telephone Encounter (Signed)
-----   Message from Georgeanna Lea, MD sent at 04/27/2023 12:21 PM EDT ----- Echocardiogram showed normal left ventricle ejection fraction mild mitral valve regurgitation, overall looks good

## 2023-05-14 ENCOUNTER — Other Ambulatory Visit: Payer: Self-pay | Admitting: Family

## 2023-05-14 MED ORDER — ZOLPIDEM TARTRATE 10 MG PO TABS: 10.0000 mg | ORAL_TABLET | Freq: Every evening | ORAL | 1 refills | Status: DC | PRN

## 2023-05-14 NOTE — Telephone Encounter (Signed)
Requesting: Ambien 10mg   Contract: None UDS: None Last Visit: 01/22/23 Next Visit: None Last Refill: 12/30/22 #30 and 0RF   Please Advise

## 2023-07-14 ENCOUNTER — Encounter: Payer: Self-pay | Admitting: Cardiology

## 2023-07-14 ENCOUNTER — Ambulatory Visit: Payer: Commercial Managed Care - PPO | Attending: Cardiology | Admitting: Cardiology

## 2023-07-14 VITALS — BP 100/72 | HR 76 | Ht 67.0 in | Wt 130.0 lb

## 2023-07-14 DIAGNOSIS — I10 Essential (primary) hypertension: Secondary | ICD-10-CM

## 2023-07-14 DIAGNOSIS — E785 Hyperlipidemia, unspecified: Secondary | ICD-10-CM | POA: Diagnosis not present

## 2023-07-14 DIAGNOSIS — I341 Nonrheumatic mitral (valve) prolapse: Secondary | ICD-10-CM

## 2023-07-14 DIAGNOSIS — K219 Gastro-esophageal reflux disease without esophagitis: Secondary | ICD-10-CM | POA: Diagnosis not present

## 2023-07-14 NOTE — Patient Instructions (Signed)

## 2023-07-14 NOTE — Progress Notes (Signed)
Cardiology Office Note:    Date:  07/14/2023   ID:  Hayley Ruiz, DOB 11/20/1966, MRN [id number]  PCP:  Olive Bass, FNP  Cardiologist:  Gypsy Balsam, MD    Referring MD: Olive Bass,*   Chief Complaint  Patient presents with   Follow-up    History of Present Illness:    Hayley Ruiz is a 57 y.o. female with past medical history significant for essential hypertension which seems to be difficult to control, palpitations, dyslipidemia.  She was referred to [place] because of history of mitral valve prolapse however she had echocardiogram done which showed only mild mitral valve regurgitation without any evidence of mitral valve prolapse, I cannot hear a click on my physical exam.  She also got a calcium score done which was 0. Comes to the [facility] for follow-up, overall doing well.  Denies having chest pain tightness squeezing pressure mid chest walk 5 dogs have no difficulty doing it.  Past Medical History:  Diagnosis Date   CHEST PAIN 10/16/2009   Qualifier: Diagnosis of   By: Alwyn Ren MD, Chrissie Noa       Gastroesophageal reflux disease without esophagitis 05/22/2015   Hallux rigidus of right foot 11/27/2020   Formatting of this note might be different from the original. Added automatically from request for surgery [id number] Formatting of this note might be different from the original. Added automatically from request for surgery [id number]   Insomnia 01/20/2014   Microscopic hematuria 07/08/2014   PALPITATIONS 10/16/2009   Qualifier: Diagnosis of   By: Alwyn Ren MD, Chrissie Noa       PMDD (premenstrual dysphoric disorder)    Right foot pain 02/23/2020   Synovitis of toe 12/01/2017    Past Surgical History:  Procedure Laterality Date   KIDNEY SURGERY      Current Medications: Current Meds  Medication Sig   B Complex Vitamins (VITAMIN B-COMPLEX) TABS Take 1 tablet by mouth daily.   cloNIDine (CATAPRES) 0.1 MG tablet Take 1 tablet (0.1 mg total) by mouth daily. At  Bedtime   FLUoxetine (PROZAC) 20 MG capsule Take 1 capsule (20 mg total) by mouth 2 (two) times daily.   fluticasone (FLONASE) 50 MCG/ACT nasal spray Place 2 sprays into both nostrils daily.   lisinopril (ZESTRIL) 5 MG tablet Take 1 tablet daily by mouth as needed for blood pressure elevated above 150/90 (Patient taking differently: Take 5 mg by mouth daily. Take 1 tablet daily by mouth as needed for blood pressure elevated above 150/90)   Multiple Vitamin (MULTI-VITAMIN DAILY PO) Take 1 tablet by mouth daily.   zolpidem (AMBIEN) 10 MG tablet Take 1 tablet (10 mg total) by mouth at bedtime as needed for sleep.     Allergies:   Hyoscyamine sulfate   Social History   Socioeconomic History   Marital status: Married    Spouse name: Not on file   Number of children: Not on file   Years of education: Not on file   Highest education level: Not on file  Occupational History   Not on file  Tobacco Use   Smoking status: Never   Smokeless tobacco: Never  Vaping Use   Vaping status: Never Used  Substance and Sexual Activity   Alcohol use: Yes    Comment: daily   Drug use: Never   Sexual activity: Not on file  Other Topics Concern   Not on file  Social History Narrative   Not on file   Social Determinants of Health  Financial Resource Strain: Not on file  Food Insecurity: Not on file  Transportation Needs: Not on file  Physical Activity: Not on file  Stress: Not on file  Social Connections: Unknown (04/20/2022)   Received from [facility]   Social Network    Social Network: Not on file     Family History: The patient's family history is not on file. ROS:   Please see the history of present illness.    All 14 point review of systems negative except as described per history of present illness  EKGs/Labs/Other Studies Reviewed:         Recent Labs: 01/22/2023: ALT 19; BUN 11; Creatinine, Ser 0.74; Hemoglobin 13.8; Platelets 234.0; Potassium 3.9; Sodium 140; TSH 2.37   Recent Lipid Panel    Component Value Date/Time   CHOL 238 (H) 01/22/2023 1127   TRIG 116.0 01/22/2023 1127   HDL 78.30 01/22/2023 1127   CHOLHDL 3 01/22/2023 1127   VLDL 23.2 01/22/2023 1127   LDLCALC 136 (H) 01/22/2023 1127    Physical Exam:    VS:  BP 100/72 (BP Location: Left Arm, Patient Position: Sitting)   Pulse 76   Ht 5\' 7"  (1.702 m)   Wt 130 lb (59 kg)   SpO2 95%   BMI 20.36 kg/m     Wt Readings from Last 3 Encounters:  07/14/23 130 lb (59 kg)  04/07/23 132 lb (59.9 kg)  01/22/23 129 lb 9.6 oz (58.8 kg)     GEN:  Well nourished, well developed in no acute distress HEENT: Normal NECK: No JVD; No carotid bruits LYMPHATICS: No lymphadenopathy CARDIAC: RRR, no murmurs, no rubs, no gallops RESPIRATORY:  Clear to auscultation without rales, wheezing or rhonchi  ABDOMEN: Soft, non-tender, non-distended MUSCULOSKELETAL:  No edema; No deformity  SKIN: Warm and dry LOWER EXTREMITIES: no swelling NEUROLOGIC:  Alert and oriented x 3 PSYCHIATRIC:  Normal affect   ASSESSMENT:    1. Essential hypertension   2. Mitral valve prolapse   3. Gastroesophageal reflux disease without esophagitis   4. Dyslipidemia    PLAN:    In order of problems listed above:  Mitral valve prolapse: I do not see any evidence of it.  Will continue monitoring. Essential hypertension blood pressure well-controlled actually today is still below she still insist on taking clonidine even though I told her this not the most desirable medication she admits that sometimes she missed her evening dose within the next day she will have elevated blood pressure I stressed importance of taking clonidine on the regular basis she likes taking clonidine because it this medication help her to sleep. Gastroesophageal reflux disease denies have any symptoms. Dyslipidemia her calcium score 0 cholesterol is acceptable we will continue present management   Medication Adjustments/Labs and Tests Ordered: Current  medicines are reviewed at length with the patient today.  Concerns regarding medicines are outlined above.  No orders of the defined types were placed in this encounter.  Medication changes: No orders of the defined types were placed in this encounter.   Signed, Georgeanna Lea, MD, [facility] 07/14/2023 9:27 AM    [facility]

## 2023-07-28 ENCOUNTER — Encounter: Payer: Self-pay | Admitting: Family

## 2023-07-29 ENCOUNTER — Ambulatory Visit (INDEPENDENT_AMBULATORY_CARE_PROVIDER_SITE_OTHER): Payer: Commercial Managed Care - PPO

## 2023-07-29 DIAGNOSIS — Z111 Encounter for screening for respiratory tuberculosis: Secondary | ICD-10-CM | POA: Diagnosis not present

## 2023-07-29 NOTE — Progress Notes (Signed)
PPD Placement note Hayley Ruiz, 57 y.o. female is here today for placement of PPD test Reason for PPD test: Work related Pt taken PPD test before: yes Verified in allergy area and with patient that they are not allergic to the products PPD is made of (Phenol or Tween). Yes Is patient taking any oral or IV steroid medication now or have they taken it in the last month? no Has the patient ever received the BCG vaccine?: no Has the patient been in recent contact with anyone known or suspected of having active TB disease?: no      Date of exposure (if applicable): N/A      Name of person they were exposed to (if applicable): N/A Patient's Country of origin?: [place] O: Alert and oriented in NAD. P:  PPD placed on 07/29/2023.  Patient advised to return for reading within 48-72 hours.

## 2023-07-31 ENCOUNTER — Ambulatory Visit: Payer: Commercial Managed Care - PPO

## 2023-07-31 DIAGNOSIS — Z111 Encounter for screening for respiratory tuberculosis: Secondary | ICD-10-CM

## 2023-07-31 LAB — TB SKIN TEST: TB Skin Test: NEGATIVE

## 2023-07-31 NOTE — Progress Notes (Signed)
PPD Reading Note  PPD read and results entered in EpicCare.  Result: 0 mm induration.  Interpretation: Negative  If test not read within 48-72 hours of initial placement, patient advised to repeat in other arm 1-3 weeks after this test.  Allergic reaction: no

## 2023-08-08 ENCOUNTER — Encounter: Payer: Self-pay | Admitting: Family

## 2023-08-11 ENCOUNTER — Other Ambulatory Visit: Payer: Self-pay | Admitting: Family

## 2023-08-12 ENCOUNTER — Other Ambulatory Visit: Payer: Self-pay | Admitting: Family

## 2023-08-12 MED ORDER — FLUOXETINE HCL 20 MG PO CAPS: 20.0000 mg | ORAL_CAPSULE | Freq: Two times a day (BID) | ORAL | 1 refills | Status: DC

## 2023-08-12 MED ORDER — ZOLPIDEM TARTRATE 10 MG PO TABS: 10.0000 mg | ORAL_TABLET | Freq: Every evening | ORAL | 1 refills | Status: DC | PRN

## 2023-09-22 IMAGING — MG MM DIGITAL SCREENING BILAT W/ TOMO AND CAD
8 series · 9 of 24 positions shown · non-contrast
Comparison: None.

CLINICAL DATA: Screening.

EXAM:
DIGITAL SCREENING BILATERAL MAMMOGRAM WITH TOMOSYNTHESIS AND CAD
TECHNIQUE: Bilateral screening digital craniocaudal and mediolateral oblique
mammograms were obtained. Bilateral screening digital breast
tomosynthesis was performed. The images were evaluated with
computer-aided detection.

[R MLO synth-2D]
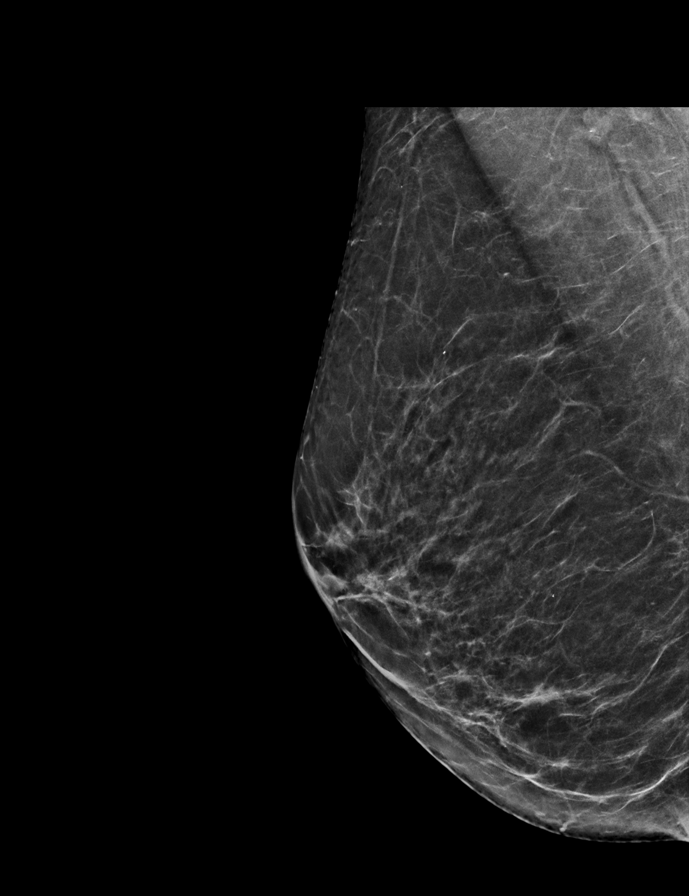

[L CC synth-2D]
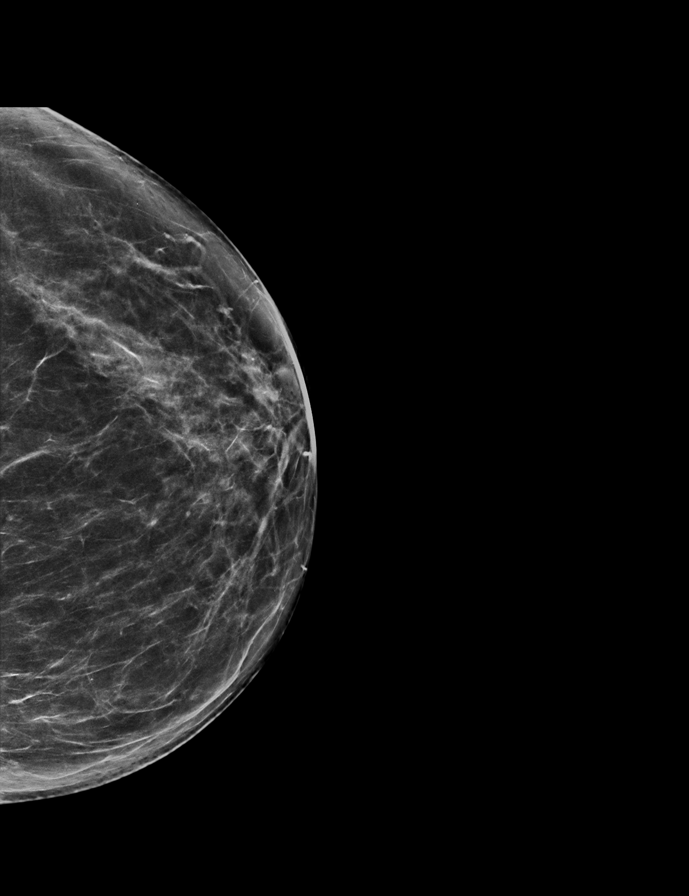

[L MLO synth-2D]
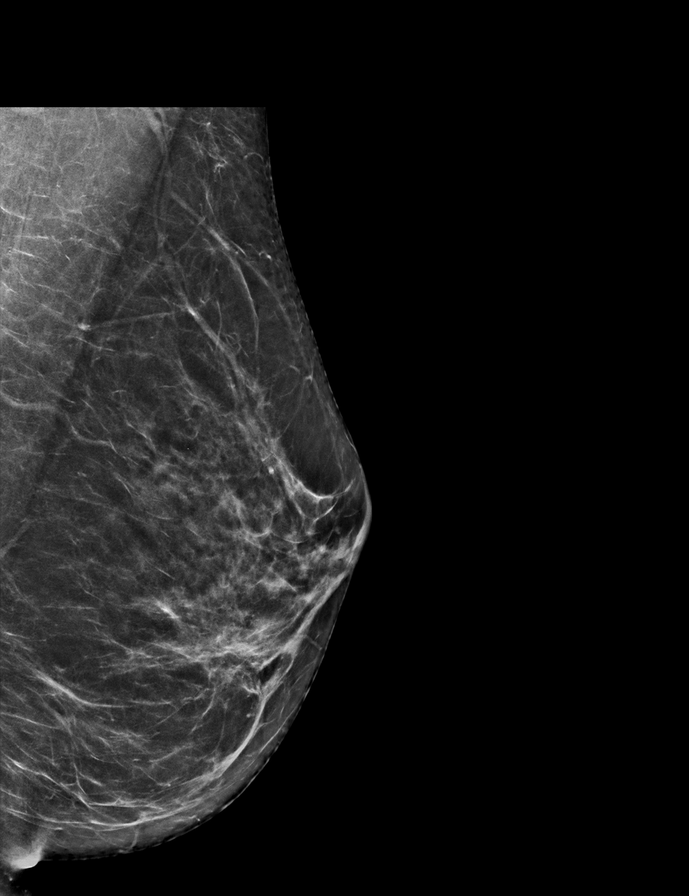

[R CC synth-2D]
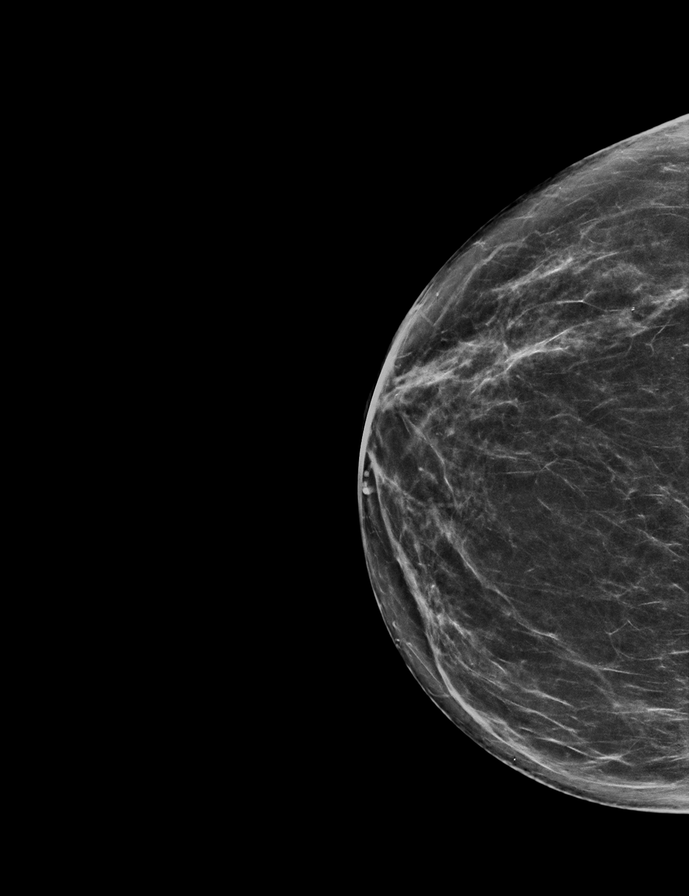

[R CC tomo · 2 of 61 frames shown]
[frame 20/61]
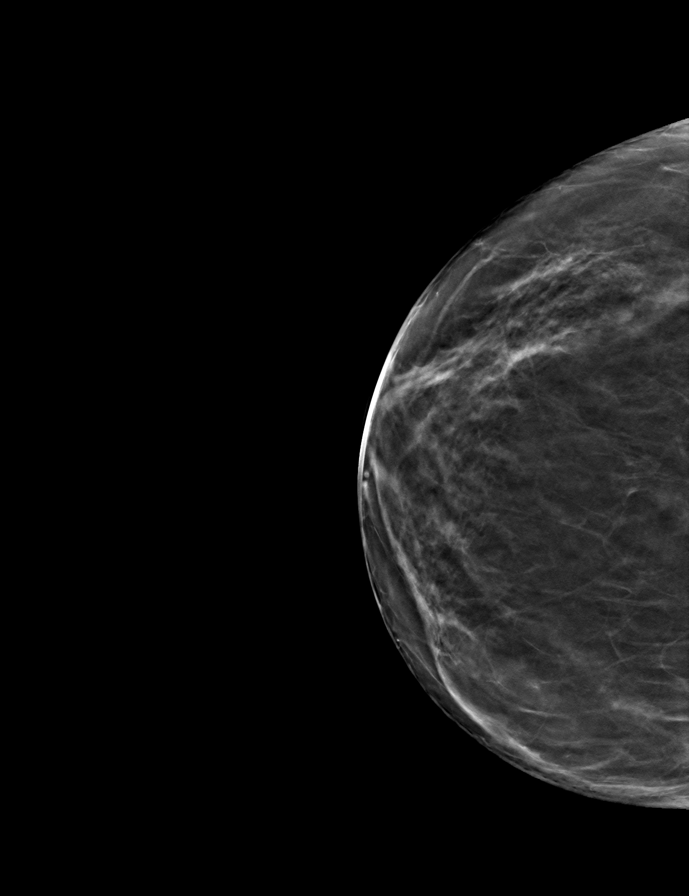
[frame 31/61]
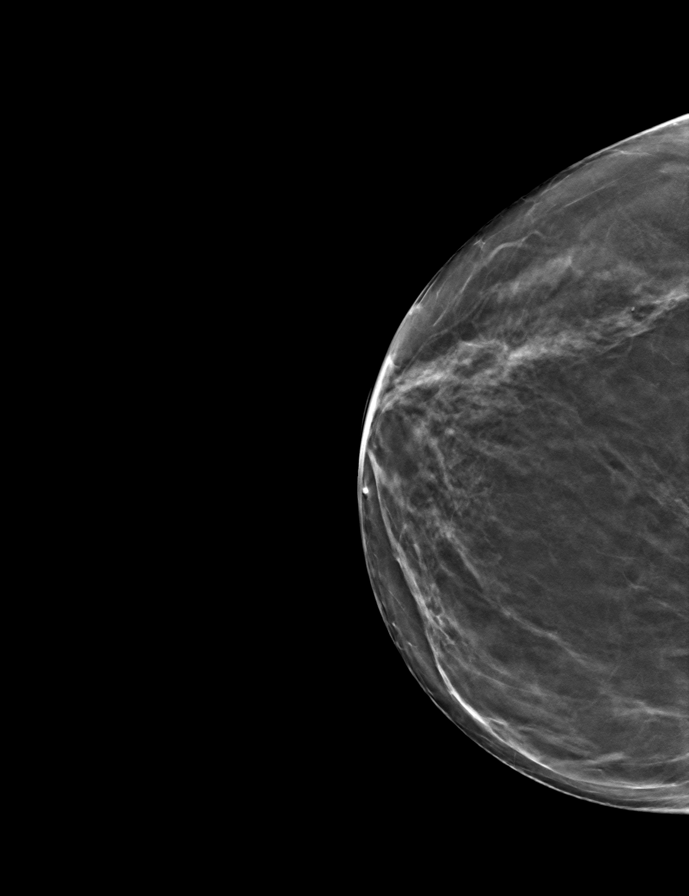

[L CC tomo · tomo slice 33/64.0]
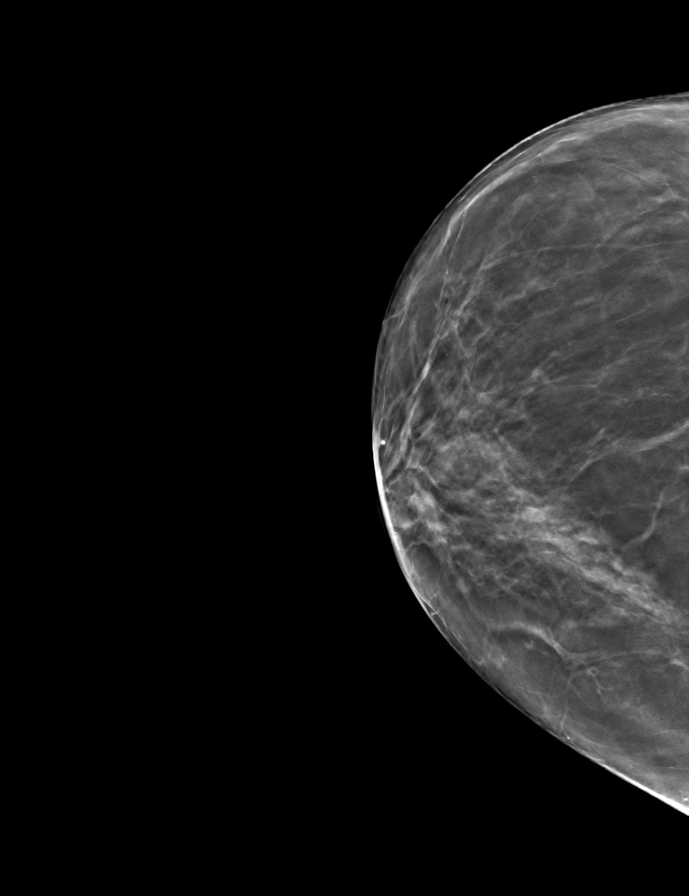

[R MLO tomo · tomo slice 29/57.0]
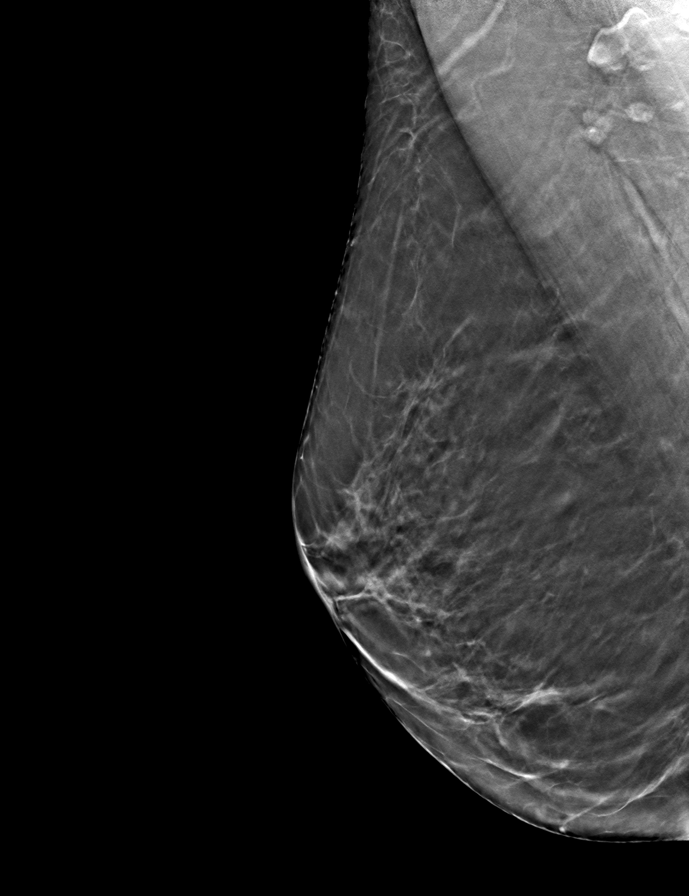

[L MLO tomo · tomo slice 31/62.0]
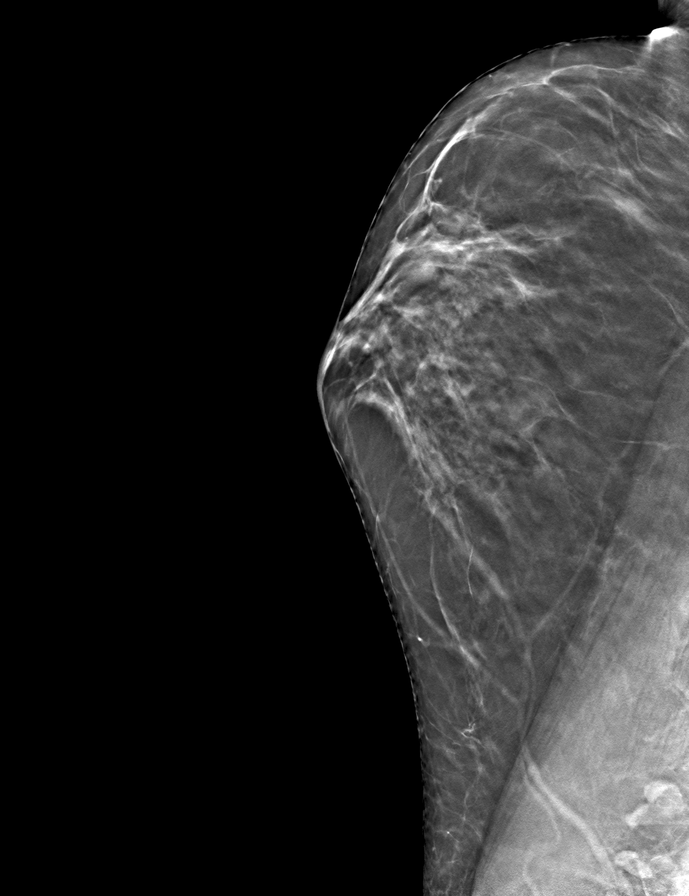

[9 of 24 positions shown; findings below may reference images not displayed]

ACR Breast Density Category b: There are scattered areas of
fibroglandular density.
FINDINGS: There are no findings suspicious for malignancy.
IMPRESSION: No mammographic evidence of malignancy. A result letter of this
screening mammogram will be mailed directly to the patient.

RECOMMENDATION:
Screening mammogram in one year. (Code:XG-X-X7B)

BI-RADS CATEGORY  1: Negative.

## 2023-10-23 ENCOUNTER — Encounter: Payer: Self-pay | Admitting: Family

## 2024-04-29 ENCOUNTER — Other Ambulatory Visit: Payer: Self-pay | Admitting: Cardiology

## 2024-05-13 ENCOUNTER — Ambulatory Visit (INDEPENDENT_AMBULATORY_CARE_PROVIDER_SITE_OTHER): Payer: Self-pay | Admitting: Family

## 2024-05-13 VITALS — BP 110/78 | HR 67 | Ht 67.0 in | Wt 133.2 lb

## 2024-05-13 DIAGNOSIS — Z860101 Personal history of adenomatous and serrated colon polyps: Secondary | ICD-10-CM

## 2024-05-13 DIAGNOSIS — R7309 Other abnormal glucose: Secondary | ICD-10-CM

## 2024-05-13 DIAGNOSIS — Z1211 Encounter for screening for malignant neoplasm of colon: Secondary | ICD-10-CM

## 2024-05-13 DIAGNOSIS — Z13220 Encounter for screening for lipoid disorders: Secondary | ICD-10-CM

## 2024-05-13 DIAGNOSIS — Z0000 Encounter for general adult medical examination without abnormal findings: Secondary | ICD-10-CM

## 2024-05-13 DIAGNOSIS — L989 Disorder of the skin and subcutaneous tissue, unspecified: Secondary | ICD-10-CM | POA: Diagnosis not present

## 2024-05-13 MED ORDER — FLUOXETINE HCL 20 MG PO CAPS: 20.0000 mg | ORAL_CAPSULE | Freq: Two times a day (BID) | ORAL | 3 refills | Status: AC

## 2024-05-13 MED ORDER — ZOLPIDEM TARTRATE 10 MG PO TABS: 10.0000 mg | ORAL_TABLET | Freq: Every evening | ORAL | 1 refills | Status: AC | PRN

## 2024-05-13 MED ORDER — LISINOPRIL 5 MG PO TABS: ORAL_TABLET | ORAL | 0 refills | Status: DC

## 2024-05-13 NOTE — Progress Notes (Signed)
 Hayley Ruiz is a 58 y.o. female with the following history as recorded in Epic[employer]:  Patient Active Problem List   Diagnosis Date Noted   Elevated blood pressure reading 04/29/2023   Essential hypertension 04/07/2023   Mitral valve prolapse 04/07/2023   Dyslipidemia 04/07/2023   Hallux rigidus of right foot 11/27/2020   Right foot pain 02/23/2020   Synovitis of toe 12/01/2017   PMDD (premenstrual dysphoric disorder) 10/09/2016   Gastroesophageal reflux disease without esophagitis 05/22/2015   Microscopic hematuria 07/08/2014   Insomnia 01/20/2014   PALPITATIONS 10/16/2009   CHEST PAIN 10/16/2009    Current Outpatient Medications  Medication Sig Dispense Refill   B Complex Vitamins (VITAMIN B-COMPLEX) TABS Take 1 tablet by mouth daily.     Multiple Vitamin (MULTI-VITAMIN DAILY PO) Take 1 tablet by mouth daily.     FLUoxetine  (PROZAC ) 20 MG capsule Take 1 capsule (20 mg total) by mouth 2 (two) times daily. 180 capsule 3   lisinopril  (ZESTRIL ) 5 MG tablet Take 1-2 tablets daily by mouth as needed for blood pressure elevated above 150/90 60 tablet 0   zolpidem  (AMBIEN ) 10 MG tablet Take 1 tablet (10 mg total) by mouth at bedtime as needed for sleep. 30 tablet 1   No current facility-administered medications for this visit.    Allergies: Hyoscyamine sulfate  Past Medical History:  Diagnosis Date   CHEST PAIN 10/16/2009   Qualifier: Diagnosis of   By: Donnice Gale MD, Sammie Crigler       Gastroesophageal reflux disease without esophagitis 05/22/2015   Hallux rigidus of right foot 11/27/2020   Formatting of this note might be different from the original. Added automatically from request for surgery [id number] Formatting of this note might be different from the original. Added automatically from request for surgery [id number]   Insomnia 01/20/2014   Microscopic hematuria 07/08/2014   PALPITATIONS 10/16/2009   Qualifier: Diagnosis of   By: Donnice Gale MD, Sammie Crigler       PMDD (premenstrual dysphoric  disorder)    Right foot pain 02/23/2020   Synovitis of toe 12/01/2017    Past Surgical History:  Procedure Laterality Date   KIDNEY SURGERY      No family history on file.  Social History   Tobacco Use   Smoking status: Never   Smokeless tobacco: Never  Substance Use Topics   Alcohol use: Yes    Comment: daily    Subjective:   Presents for yearly CPE; prefers to do her mammograms every 2 years- due in 2026; pap smear due in 2026; needs colonoscopy ordered;  Concerned about regimen for her blood pressure- had opted to take Clonidine  and use Lisinopril  prn if pressure above 150/90;   Review of Systems  Constitutional: Negative.   HENT: Negative.    Eyes: Negative.   Respiratory: Negative.    Cardiovascular: Negative.   Gastrointestinal: Negative.   Genitourinary: Negative.   Musculoskeletal: Negative.   Skin: Negative.   Neurological: Negative.   Endo/Heme/Allergies: Negative.   Psychiatric/Behavioral: Negative.       Objective:  Vitals:   05/13/24 1502  BP: 110/78  Pulse: 67  SpO2: 99%  Weight: 133 lb 3.2 oz (60.4 kg)  Height: 5\' 7"  (1.702 m)    General: Well developed, well nourished, in no acute distress  Skin : Warm and dry.  Head: Normocephalic and atraumatic  Eyes: Sclera and conjunctiva clear; pupils round and reactive to light; extraocular movements intact  Ears: External normal; canals clear; tympanic membranes normal  Oropharynx: Pink,  supple. No suspicious lesions  Neck: Supple without thyromegaly, adenopathy  Lungs: Respirations unlabored; clear to auscultation bilaterally without wheeze, rales, rhonchi  CVS exam: normal rate and regular rhythm.  Abdomen: Soft; nontender; nondistended; normoactive bowel sounds; no masses or hepatosplenomegaly  Musculoskeletal: No deformities; no active joint inflammation  Extremities: No edema, cyanosis, clubbing  Vessels: Symmetric bilaterally  Neurologic: Alert and oriented; speech intact; face symmetrical;  moves all extremities well; CNII-XII intact without focal deficit   Assessment:  1. PE (physical exam), annual   2. Encounter for colonoscopy due to history of adenomatous colonic polyps   3. Skin lesion   4. Lipid screening   5. Elevated glucose     Plan:  Age appropriate preventive healthcare needs addressed; encouraged regular eye doctor and dental exams; encouraged regular exercise; will update refills as needed today; She will return for fasting labs. Patient will try to transition from Clonidine  with Lisinopril  prn to Lisinopril  daily; she will monitor her blood pressure and call back with readings in 7-10 days; she will take 10 mg Lisinopril  daily for now and can adjust accordingly;  Referral to GI for colonoscopy and dermatology updated;    Return for follow up fasting labs.  Orders Placed This Encounter  Procedures   CBC with Differential/Platelet    Standing Status:   Future    Expiration Date:   05/13/2025   Comp Met (CMET)    Standing Status:   Future    Expiration Date:   05/13/2025   Lipid panel    Standing Status:   Future    Expiration Date:   05/13/2025   Hemoglobin A1c    Standing Status:   Future    Expiration Date:   05/13/2025   Ambulatory referral to Gastroenterology    Referral Priority:   Routine    Referral Type:   Consultation    Referral Reason:   Specialty Services Required    Number of Visits Requested:   1   Ambulatory referral to Dermatology    Referral Priority:   Routine    Referral Type:   Consultation    Referral Reason:   Specialty Services Required    Requested Specialty:   Dermatology    Number of Visits Requested:   1    Requested Prescriptions   Signed Prescriptions Disp Refills   FLUoxetine  (PROZAC ) 20 MG capsule 180 capsule 3    Sig: Take 1 capsule (20 mg total) by mouth 2 (two) times daily.   lisinopril  (ZESTRIL ) 5 MG tablet 60 tablet 0    Sig: Take 1-2 tablets daily by mouth as needed for blood pressure elevated above 150/90    zolpidem  (AMBIEN ) 10 MG tablet 30 tablet 1    Sig: Take 1 tablet (10 mg total) by mouth at bedtime as needed for sleep.

## 2024-05-16 ENCOUNTER — Other Ambulatory Visit (INDEPENDENT_AMBULATORY_CARE_PROVIDER_SITE_OTHER)

## 2024-05-16 ENCOUNTER — Ambulatory Visit: Payer: Self-pay | Admitting: Family

## 2024-05-16 DIAGNOSIS — Z13220 Encounter for screening for lipoid disorders: Secondary | ICD-10-CM | POA: Diagnosis not present

## 2024-05-16 DIAGNOSIS — Z0000 Encounter for general adult medical examination without abnormal findings: Secondary | ICD-10-CM

## 2024-05-16 DIAGNOSIS — R7309 Other abnormal glucose: Secondary | ICD-10-CM

## 2024-05-16 LAB — CBC WITH DIFFERENTIAL/PLATELET
Basophils Absolute: 0.1 10*3/uL (ref 0.0–0.1)
Basophils Relative: 2.2 % (ref 0.0–3.0)
Eosinophils Absolute: 0.1 10*3/uL (ref 0.0–0.7)
Eosinophils Relative: 3.2 % (ref 0.0–5.0)
HCT: 36.6 % (ref 36.0–46.0)
Hemoglobin: 12.6 g/dL (ref 12.0–15.0)
Lymphocytes Relative: 35.3 % (ref 12.0–46.0)
Lymphs Abs: 1.5 10*3/uL (ref 0.7–4.0)
MCHC: 34.4 g/dL (ref 30.0–36.0)
MCV: 92.6 fl (ref 78.0–100.0)
Monocytes Absolute: 0.4 10*3/uL (ref 0.1–1.0)
Monocytes Relative: 9.4 % (ref 3.0–12.0)
Neutro Abs: 2.2 10*3/uL (ref 1.4–7.7)
Neutrophils Relative %: 49.9 % (ref 43.0–77.0)
Platelets: 191 10*3/uL (ref 150.0–400.0)
RBC: 3.95 Mil/uL (ref 3.87–5.11)
RDW: 12.3 % (ref 11.5–15.5)
WBC: 4.3 10*3/uL (ref 4.0–10.5)

## 2024-05-16 LAB — COMPREHENSIVE METABOLIC PANEL WITH GFR
ALT: 15 U/L (ref 0–35)
AST: 19 U/L (ref 0–37)
Albumin: 4 g/dL (ref 3.5–5.2)
Alkaline Phosphatase: 51 U/L (ref 39–117)
BUN: 17 mg/dL (ref 6–23)
CO2: 28 meq/L (ref 19–32)
Calcium: 9 mg/dL (ref 8.4–10.5)
Chloride: 107 meq/L (ref 96–112)
Creatinine, Ser: 0.69 mg/dL (ref 0.40–1.20)
GFR: 97.5 mL/min (ref >60.00)
Glucose, Bld: 106 mg/dL — ABNORMAL HIGH (ref 70–99)
Potassium: 4.1 meq/L (ref 3.5–5.1)
Sodium: 142 meq/L (ref 135–145)
Total Bilirubin: 0.7 mg/dL (ref 0.2–1.2)
Total Protein: 6.1 g/dL (ref 6.0–8.3)

## 2024-05-16 LAB — HEMOGLOBIN A1C: Hgb A1c MFr Bld: 5.3 % (ref 4.6–6.5)

## 2024-05-16 LAB — LIPID PANEL
Cholesterol: 209 mg/dL — ABNORMAL HIGH (ref 0–200)
HDL: 67.5 mg/dL (ref >39.00)
LDL Cholesterol: 117 mg/dL — ABNORMAL HIGH (ref 0–99)
NonHDL: 141.32
Total CHOL/HDL Ratio: 3
Triglycerides: 121 mg/dL (ref 0.0–149.0)
VLDL: 24.2 mg/dL (ref 0.0–40.0)

## 2024-05-16 NOTE — Addendum Note (Signed)
 Addended by: Marigene Shoulder on: 05/16/2024 07:59 AM   Modules accepted: Orders

## 2024-06-11 ENCOUNTER — Encounter: Payer: Self-pay | Admitting: Family

## 2024-06-13 ENCOUNTER — Other Ambulatory Visit: Payer: Self-pay | Admitting: Family

## 2024-06-13 MED ORDER — LISINOPRIL 5 MG PO TABS: ORAL_TABLET | ORAL | 0 refills | Status: DC

## 2024-07-25 ENCOUNTER — Other Ambulatory Visit: Payer: Self-pay | Admitting: Cardiology

## 2024-09-19 ENCOUNTER — Other Ambulatory Visit: Payer: Self-pay | Admitting: Family

## 2024-09-19 MED ORDER — LISINOPRIL 5 MG PO TABS: ORAL_TABLET | ORAL | 0 refills | Status: DC

## 2024-09-19 NOTE — Telephone Encounter (Signed)
 Copied from CRM [id number]. Topic: Clinical - Medication Refill >> Sep 19, 2024  9:44 AM Burnard DEL wrote: Medication: lisinopril  (ZESTRIL ) 5 MG tablet  Has the patient contacted their pharmacy? No (Agent: If no, request that the patient contact the pharmacy for the refill. If patient does not wish to contact the pharmacy document the reason why and proceed with request.) (Agent: If yes, when and what did the pharmacy advise?)  This is the patient's preferred pharmacy:  [id number] - [place], [place] - [address] [place] [address] Phone: [phone] Fax: [phone]  Is this the correct pharmacy for this prescription? Yes If no, delete pharmacy and type the correct one.   Has the prescription been filled recently? No  Is the patient out of the medication? No(2 days left)  Has the patient been seen for an appointment in the last year OR does the patient have an upcoming appointment? yes  Can we respond through MyChart? Yes  Agent: Please be advised that Rx refills may take up to 3 business days. We ask that you follow-up with your pharmacy.

## 2024-10-06 ENCOUNTER — Telehealth: Payer: Self-pay | Admitting: Cardiology

## 2024-10-06 ENCOUNTER — Ambulatory Visit: Payer: Self-pay

## 2024-10-06 MED ORDER — LOSARTAN POTASSIUM 25 MG PO TABS: 25.0000 mg | ORAL_TABLET | Freq: Every day | ORAL | 3 refills | Status: AC

## 2024-10-06 NOTE — Telephone Encounter (Signed)
 Pt is returning call to a nurse

## 2024-10-06 NOTE — Telephone Encounter (Signed)
 Pt reported itchy hand and feet after taking Lisinopril . Per Dr. Krasowski. Stop Lisinopril  and try Losartan 25mg  daily. Pt verbalized understanding and had no further questions.

## 2024-10-06 NOTE — Telephone Encounter (Signed)
 Pt c/o medication issue:  1. Name of Medication:   lisinopril  (ZESTRIL ) 5 MG tablet    2. How are you currently taking this medication (dosage and times per day)?   Take 1-2 tablets daily by mouth as needed for blood pressure elevated above 150/90    3. Are you having a reaction (difficulty breathing--STAT)? Yes, itching hands and feet   4. What is your medication issue? Pt has itchy hands and feet. Feels it is related to the medication. Please advise.

## 2024-10-06 NOTE — Telephone Encounter (Signed)
 FYI Only or Action Required?: Action required by provider: clinical question for provider. Pt reporting onset of itching in hands and feet, concerned about it being caused by lisinopril  and wanting to switch BP meds.  Patient was last seen in primary care on 05/13/2024 by Jason Leita Repine, FNP.  Called Nurse Triage reporting Medication Reaction.  Symptoms began several days ago.  Interventions attempted: Other: Stopped lisinopril .  Symptoms are: gradually improving.  Triage Disposition: Call PCP When Office is Open  Patient/caregiver understands and will follow disposition?: Yes  Copied from CRM #[id number]. Topic: Clinical - Red Word Triage >> Oct 06, 2024  4:34 PM Wess RAMAN wrote: Red Word that prompted transfer to Nurse Triage:  Patient thinks she is having a reaction to lisinopril  (ZESTRIL ) 5 MG tablet and she may need a new blood pressure medication. She is itching and doesn't feel safe taking it again, also ringing in ears, headache  BP 135/89 (1 hr ago)  Pharmacy: [id number] - [place], [place] - [address] [place] [address] Phone: [phone] Fax: [phone] Hours: Not open 24 hours Reason for Disposition  [1] Caller has NON-URGENT medicine question about med that PCP prescribed AND [2] triager unable to answer question  Answer Assessment - Initial Assessment Questions Pts PCP Leita Jason no longer at [facility]. TOC appt scheduled for January. Offered to schedule appt with different provider at [facility] tomorrow or mobile unit today, declines d/t pts schedule/obligations. Mobile  Unit not available tomorrow. Attempted to call [facility] with no answer. Advised UC to have current symptoms addressed, and to keep TOC appt in January.  1. NAME of MEDICINE: What medicine(s) are you calling about?     Lisinporil 5 mg bid  2. QUESTION: What is your question? (e.g., double dose of medicine, side effect)     Pt has been taking  lisinopril  bid since the beginning of this year. Now with onset of itching in fingertips and toes past 2 days. Pt has stopped taking the lisinpril, last dose last night and reports improvement in symptoms, except now with mild headache and ringing in ears due to increase in blood pressure. BP 135/89 1 hr ago. Wanting to know if BP med can be switched to losartan or other med. Denies swelling of lips tongue or throat.  3. PRESCRIBER: Who prescribed the medicine? Reason: if prescribed by specialist, call should be referred to that group.     Waddell Mon, NP  4. SYMPTOMS: Do you have any symptoms? If Yes, ask: What symptoms are you having?  How bad are the symptoms (e.g., mild, moderate, severe)     Headache 3/10, ringing in ears, itching in fingertips and toes. Has gone away now  Protocols used: Medication Question Call-A-AH

## 2024-10-06 NOTE — Telephone Encounter (Signed)
 Attempted to call the patient. Patient did not answer the phone. Mail box was full so unable to leave a message.

## 2024-10-07 NOTE — Telephone Encounter (Signed)
 Looks like cardiology is going to address this.

## 2024-11-01 ENCOUNTER — Telehealth: Payer: Self-pay | Admitting: Cardiology

## 2024-11-01 NOTE — Telephone Encounter (Signed)
*  STAT* If patient is at the pharmacy, call can be transferred to refill team.   1. Which medications need to be refilled? (please list name of each medication and dose if known)   losartan  (COZAAR ) 25 MG tablet   2. Would you like to learn more about the convenience, safety, & potential cost savings by using the [facility]?   3. Are you open to using the [facility] (Type [facility]. ).  4. Which pharmacy/location (including street and city if local pharmacy) is medication to be sent to?  [id number] - [place], [place] - [address]   5. Do they need a 30 day or 90 day supply?  90 day  Patient stated she has 3 days left of this medication.  Patient has appointment scheduled with Dr. Bernie on 2/3.

## 2024-11-01 NOTE — Telephone Encounter (Signed)
 Returned call to pt, she has been made aware that there was refills placed on the Losartan  10/06/24, when the medication change was made.  She will call them and request refill.

## 2024-12-20 ENCOUNTER — Encounter: Admitting: Family Medicine

## 2025-01-10 ENCOUNTER — Ambulatory Visit: Payer: Self-pay | Admitting: Cardiology

## 2025-01-10 ENCOUNTER — Encounter: Payer: Self-pay | Admitting: Cardiology

## 2025-01-10 VITALS — BP 110/80 | HR 67 | Ht 67.0 in | Wt 135.0 lb

## 2025-01-10 DIAGNOSIS — E785 Hyperlipidemia, unspecified: Secondary | ICD-10-CM | POA: Diagnosis not present

## 2025-01-10 DIAGNOSIS — I10 Essential (primary) hypertension: Secondary | ICD-10-CM | POA: Diagnosis not present

## 2025-01-10 DIAGNOSIS — I341 Nonrheumatic mitral (valve) prolapse: Secondary | ICD-10-CM | POA: Diagnosis not present

## 2025-01-10 DIAGNOSIS — R002 Palpitations: Secondary | ICD-10-CM

## 2025-01-10 NOTE — Patient Instructions (Signed)

## 2025-06-15 ENCOUNTER — Encounter: Admitting: Family Medicine
